# Patient Record
Sex: Female | Born: 1945
Health system: Southern US, Community
[De-identification: ages and names within clinical notes are randomized; demographics above are authoritative.]

## PROBLEM LIST (undated history)

## (undated) DIAGNOSIS — E079 Disorder of thyroid, unspecified: Secondary | ICD-10-CM

## (undated) DIAGNOSIS — I10 Essential (primary) hypertension: Secondary | ICD-10-CM

## (undated) HISTORY — PX: ABDOMINAL HYSTERECTOMY: SHX81

## (undated) HISTORY — PX: BREAST SURGERY: SHX581

---

## 1997-05-02 ENCOUNTER — Ambulatory Visit (HOSPITAL_COMMUNITY): Admission: RE | Admit: 1997-05-02 | Discharge: 1997-05-02 | Payer: Self-pay | Admitting: Obstetrics and Gynecology

## 1998-07-31 ENCOUNTER — Other Ambulatory Visit: Admission: RE | Admit: 1998-07-31 | Discharge: 1998-07-31 | Payer: Self-pay | Admitting: *Deleted

## 1998-07-31 ENCOUNTER — Other Ambulatory Visit: Admission: RE | Admit: 1998-07-31 | Discharge: 1998-07-31 | Payer: Self-pay | Admitting: Obstetrics and Gynecology

## 1999-08-28 ENCOUNTER — Other Ambulatory Visit: Admission: RE | Admit: 1999-08-28 | Discharge: 1999-08-28 | Payer: Self-pay | Admitting: Obstetrics and Gynecology

## 2000-01-28 ENCOUNTER — Encounter: Payer: Self-pay | Admitting: Family Medicine

## 2000-01-28 ENCOUNTER — Encounter: Admission: RE | Admit: 2000-01-28 | Discharge: 2000-01-28 | Payer: Self-pay | Admitting: Family Medicine

## 2000-07-03 ENCOUNTER — Ambulatory Visit (HOSPITAL_COMMUNITY): Admission: RE | Admit: 2000-07-03 | Discharge: 2000-07-03 | Payer: Self-pay | Admitting: Family Medicine

## 2000-07-03 ENCOUNTER — Encounter: Admission: RE | Admit: 2000-07-03 | Discharge: 2000-07-03 | Payer: Self-pay | Admitting: Family Medicine

## 2000-07-17 ENCOUNTER — Ambulatory Visit (HOSPITAL_COMMUNITY): Admission: RE | Admit: 2000-07-17 | Discharge: 2000-07-17 | Payer: Self-pay | Admitting: Family Medicine

## 2000-09-15 ENCOUNTER — Other Ambulatory Visit: Admission: RE | Admit: 2000-09-15 | Discharge: 2000-09-15 | Payer: Self-pay | Admitting: Obstetrics and Gynecology

## 2001-10-22 ENCOUNTER — Other Ambulatory Visit: Admission: RE | Admit: 2001-10-22 | Discharge: 2001-10-22 | Payer: Self-pay | Admitting: Obstetrics and Gynecology

## 2002-03-08 ENCOUNTER — Ambulatory Visit (HOSPITAL_COMMUNITY): Admission: RE | Admit: 2002-03-08 | Discharge: 2002-03-08 | Payer: Self-pay | Admitting: Internal Medicine

## 2002-03-08 ENCOUNTER — Emergency Department (HOSPITAL_COMMUNITY): Admission: EM | Admit: 2002-03-08 | Discharge: 2002-03-08 | Payer: Self-pay | Admitting: Emergency Medicine

## 2002-03-08 ENCOUNTER — Encounter: Payer: Self-pay | Admitting: Emergency Medicine

## 2002-03-08 ENCOUNTER — Encounter: Payer: Self-pay | Admitting: Internal Medicine

## 2002-04-22 ENCOUNTER — Encounter: Admission: RE | Admit: 2002-04-22 | Discharge: 2002-04-22 | Payer: Self-pay | Admitting: Endocrinology

## 2002-04-22 ENCOUNTER — Encounter: Payer: Self-pay | Admitting: Endocrinology

## 2002-10-26 ENCOUNTER — Other Ambulatory Visit: Admission: RE | Admit: 2002-10-26 | Discharge: 2002-10-26 | Payer: Self-pay | Admitting: Obstetrics and Gynecology

## 2003-06-03 ENCOUNTER — Ambulatory Visit (HOSPITAL_COMMUNITY): Admission: RE | Admit: 2003-06-03 | Discharge: 2003-06-03 | Payer: Self-pay | Admitting: Endocrinology

## 2003-06-23 ENCOUNTER — Ambulatory Visit (HOSPITAL_COMMUNITY): Admission: RE | Admit: 2003-06-23 | Discharge: 2003-06-23 | Payer: Self-pay | Admitting: Neurology

## 2004-08-07 ENCOUNTER — Ambulatory Visit: Payer: Self-pay | Admitting: Gastroenterology

## 2004-11-30 ENCOUNTER — Other Ambulatory Visit: Admission: RE | Admit: 2004-11-30 | Discharge: 2004-11-30 | Payer: Self-pay | Admitting: Obstetrics and Gynecology

## 2009-02-10 ENCOUNTER — Encounter: Admission: RE | Admit: 2009-02-10 | Discharge: 2009-02-10 | Payer: Self-pay | Admitting: Obstetrics and Gynecology

## 2010-08-17 NOTE — Op Note (Signed)
NAME:  Maria Houston, Maria Houston                       ACCOUNT NO.:  [id number]   MEDICAL RECORD NO.:  [id number]                   PATIENT TYPE:  OUT   LOCATION:  [facility]                                  FACILITY:  [facility]   PHYSICIAN:  Melvyn Novas, M.D.               DATE OF BIRTH:  01/06/1946   DATE OF PROCEDURE:  06/23/2003  DATE OF DISCHARGE:                                 OPERATIVE REPORT   PROCEDURES:  Lumbar puncture for the evaluation of possible demyelination  disease __________.   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  She  also has significant periventricular white matter lesions by MRI.   DESCRIPTION OF PROCEDURE:  The patient was placed at the beside bending  forwards and easily anatomic landmarks were found.  Access was between L2  and L3 after lidocaine topical anesthesia and Betadine preparation.  Four  vials of 2.5 mL of cerebrospinal fluid each were obtained and are going to  be sent for cell count, differential, glucose, protein, Gram's stain,  oligoclonal bands, Lyme disease antigen, and Gardnerella __________ antigen,  the cat scratch disease antigen.  The rest of the fluid, if any is left,  will be refrigerated for further reference.  The patient proceeded to  tolerate the procedure well.  She will go home with a headache prescription  in case she develops spinal problems.                                               Melvyn Novas, M.D.    CD/MEDQ  D:  06/23/2003  T:  06/23/2003  Job:  [id number]   cc:   [facility]

## 2011-06-04 DIAGNOSIS — Z1289 Encounter for screening for malignant neoplasm of other sites: Secondary | ICD-10-CM | POA: Diagnosis not present

## 2011-06-04 DIAGNOSIS — Z1231 Encounter for screening mammogram for malignant neoplasm of breast: Secondary | ICD-10-CM | POA: Diagnosis not present

## 2012-02-24 DIAGNOSIS — H40019 Open angle with borderline findings, low risk, unspecified eye: Secondary | ICD-10-CM | POA: Diagnosis not present

## 2012-03-26 DIAGNOSIS — I519 Heart disease, unspecified: Secondary | ICD-10-CM | POA: Diagnosis not present

## 2012-03-26 DIAGNOSIS — M79609 Pain in unspecified limb: Secondary | ICD-10-CM | POA: Diagnosis not present

## 2012-03-26 DIAGNOSIS — R262 Difficulty in walking, not elsewhere classified: Secondary | ICD-10-CM | POA: Diagnosis not present

## 2012-03-26 DIAGNOSIS — M25569 Pain in unspecified knee: Secondary | ICD-10-CM | POA: Diagnosis not present

## 2012-03-26 DIAGNOSIS — K3580 Unspecified acute appendicitis: Secondary | ICD-10-CM | POA: Diagnosis not present

## 2012-03-26 DIAGNOSIS — M25469 Effusion, unspecified knee: Secondary | ICD-10-CM | POA: Diagnosis not present

## 2012-03-29 DIAGNOSIS — M25569 Pain in unspecified knee: Secondary | ICD-10-CM | POA: Diagnosis not present

## 2012-03-29 DIAGNOSIS — M6580 Other synovitis and tenosynovitis, unspecified site: Secondary | ICD-10-CM | POA: Diagnosis not present

## 2012-04-27 DIAGNOSIS — IMO0002 Reserved for concepts with insufficient information to code with codable children: Secondary | ICD-10-CM | POA: Diagnosis not present

## 2012-04-27 DIAGNOSIS — H16109 Unspecified superficial keratitis, unspecified eye: Secondary | ICD-10-CM | POA: Diagnosis not present

## 2012-05-02 DIAGNOSIS — M1710 Unilateral primary osteoarthritis, unspecified knee: Secondary | ICD-10-CM | POA: Diagnosis not present

## 2012-05-11 DIAGNOSIS — IMO0002 Reserved for concepts with insufficient information to code with codable children: Secondary | ICD-10-CM | POA: Diagnosis not present

## 2012-06-08 DIAGNOSIS — IMO0002 Reserved for concepts with insufficient information to code with codable children: Secondary | ICD-10-CM | POA: Diagnosis not present

## 2012-06-22 DIAGNOSIS — IMO0002 Reserved for concepts with insufficient information to code with codable children: Secondary | ICD-10-CM | POA: Diagnosis not present

## 2012-07-09 DIAGNOSIS — Z1231 Encounter for screening mammogram for malignant neoplasm of breast: Secondary | ICD-10-CM | POA: Diagnosis not present

## 2012-07-09 DIAGNOSIS — N9089 Other specified noninflammatory disorders of vulva and perineum: Secondary | ICD-10-CM | POA: Diagnosis not present

## 2012-07-09 DIAGNOSIS — Z1289 Encounter for screening for malignant neoplasm of other sites: Secondary | ICD-10-CM | POA: Diagnosis not present

## 2012-07-20 DIAGNOSIS — M25579 Pain in unspecified ankle and joints of unspecified foot: Secondary | ICD-10-CM | POA: Diagnosis not present

## 2012-07-20 DIAGNOSIS — M1710 Unilateral primary osteoarthritis, unspecified knee: Secondary | ICD-10-CM | POA: Diagnosis not present

## 2012-08-31 DIAGNOSIS — M1710 Unilateral primary osteoarthritis, unspecified knee: Secondary | ICD-10-CM | POA: Diagnosis not present

## 2012-11-10 DIAGNOSIS — H40019 Open angle with borderline findings, low risk, unspecified eye: Secondary | ICD-10-CM | POA: Diagnosis not present

## 2012-11-10 DIAGNOSIS — H16109 Unspecified superficial keratitis, unspecified eye: Secondary | ICD-10-CM | POA: Diagnosis not present

## 2013-02-02 DIAGNOSIS — F411 Generalized anxiety disorder: Secondary | ICD-10-CM | POA: Diagnosis not present

## 2013-02-02 DIAGNOSIS — M2550 Pain in unspecified joint: Secondary | ICD-10-CM | POA: Diagnosis not present

## 2013-02-02 DIAGNOSIS — E785 Hyperlipidemia, unspecified: Secondary | ICD-10-CM | POA: Diagnosis not present

## 2013-02-02 DIAGNOSIS — I10 Essential (primary) hypertension: Secondary | ICD-10-CM | POA: Diagnosis not present

## 2013-02-02 DIAGNOSIS — E039 Hypothyroidism, unspecified: Secondary | ICD-10-CM | POA: Diagnosis not present

## 2013-02-02 DIAGNOSIS — Z1331 Encounter for screening for depression: Secondary | ICD-10-CM | POA: Diagnosis not present

## 2013-02-02 DIAGNOSIS — Z0000 Encounter for general adult medical examination without abnormal findings: Secondary | ICD-10-CM | POA: Diagnosis not present

## 2013-02-02 DIAGNOSIS — E1169 Type 2 diabetes mellitus with other specified complication: Secondary | ICD-10-CM | POA: Diagnosis not present

## 2013-02-17 ENCOUNTER — Other Ambulatory Visit: Payer: Self-pay | Admitting: Obstetrics and Gynecology

## 2013-02-18 ENCOUNTER — Other Ambulatory Visit: Payer: Self-pay | Admitting: Obstetrics and Gynecology

## 2013-03-02 DIAGNOSIS — I10 Essential (primary) hypertension: Secondary | ICD-10-CM | POA: Diagnosis not present

## 2013-03-05 ENCOUNTER — Ambulatory Visit
Admission: RE | Admit: 2013-03-05 | Discharge: 2013-03-05 | Disposition: A | Discharge status: Home / Self Care | Payer: Medicare Other | Source: Ambulatory Visit | Attending: Obstetrics and Gynecology | Admitting: Obstetrics and Gynecology

## 2013-03-05 DIAGNOSIS — N6459 Other signs and symptoms in breast: Secondary | ICD-10-CM | POA: Diagnosis not present

## 2013-03-05 DIAGNOSIS — I10 Essential (primary) hypertension: Secondary | ICD-10-CM | POA: Diagnosis not present

## 2013-04-15 ENCOUNTER — Emergency Department (HOSPITAL_BASED_OUTPATIENT_CLINIC_OR_DEPARTMENT_OTHER)
Admission: EM | Admit: 2013-04-15 | Discharge: 2013-04-15 | Disposition: A | Discharge status: Home / Self Care | Payer: Medicare Other | Attending: Emergency Medicine | Admitting: Emergency Medicine

## 2013-04-15 ENCOUNTER — Emergency Department (HOSPITAL_BASED_OUTPATIENT_CLINIC_OR_DEPARTMENT_OTHER): Payer: Medicare Other

## 2013-04-15 ENCOUNTER — Encounter (HOSPITAL_BASED_OUTPATIENT_CLINIC_OR_DEPARTMENT_OTHER): Payer: Self-pay | Admitting: Emergency Medicine

## 2013-04-15 DIAGNOSIS — R5381 Other malaise: Secondary | ICD-10-CM | POA: Diagnosis not present

## 2013-04-15 DIAGNOSIS — I10 Essential (primary) hypertension: Secondary | ICD-10-CM | POA: Diagnosis not present

## 2013-04-15 DIAGNOSIS — R059 Cough, unspecified: Secondary | ICD-10-CM | POA: Diagnosis not present

## 2013-04-15 DIAGNOSIS — R05 Cough: Secondary | ICD-10-CM | POA: Diagnosis not present

## 2013-04-15 DIAGNOSIS — E079 Disorder of thyroid, unspecified: Secondary | ICD-10-CM | POA: Diagnosis not present

## 2013-04-15 DIAGNOSIS — Z79899 Other long term (current) drug therapy: Secondary | ICD-10-CM | POA: Insufficient documentation

## 2013-04-15 DIAGNOSIS — R21 Rash and other nonspecific skin eruption: Secondary | ICD-10-CM | POA: Insufficient documentation

## 2013-04-15 DIAGNOSIS — J209 Acute bronchitis, unspecified: Secondary | ICD-10-CM | POA: Insufficient documentation

## 2013-04-15 DIAGNOSIS — R5383 Other fatigue: Secondary | ICD-10-CM

## 2013-04-15 HISTORY — DX: Disorder of thyroid, unspecified: E07.9

## 2013-04-15 HISTORY — DX: Essential (primary) hypertension: I10

## 2013-04-15 MED ORDER — AZITHROMYCIN 250 MG PO TABS: ORAL_TABLET | ORAL | Status: AC

## 2013-04-15 MED ORDER — PREDNISONE 10 MG PO TABS: 20.0000 mg | ORAL_TABLET | Freq: Two times a day (BID) | ORAL | Status: AC

## 2013-04-15 MED ORDER — HYDROCOD POLST-CHLORPHEN POLST 10-8 MG/5ML PO LQCR: 5.0000 mL | Freq: Two times a day (BID) | ORAL | Status: AC | PRN

## 2013-04-15 NOTE — ED Notes (Signed)
Dry cough x 3 weeks 

## 2013-04-15 NOTE — Discharge Instructions (Signed)
Zithromax as prescribed.  Robitussin as prescribed as needed for cough.   Prednisone as prescribed.   Bronchitis Bronchitis is inflammation of the airways that extend from the windpipe into the lungs (bronchi). The inflammation often causes mucus to develop, which leads to a cough. If the inflammation becomes severe, it may cause shortness of breath. CAUSES  Bronchitis may be caused by:   Viral infections.   Bacteria.   Cigarette smoke.   Allergens, pollutants, and other irritants.  SIGNS AND SYMPTOMS  The most common symptom of bronchitis is a frequent cough that produces mucus. Other symptoms include:  Fever.   Body aches.   Chest congestion.   Chills.   Shortness of breath.   Sore throat.  DIAGNOSIS  Bronchitis is usually diagnosed through a medical history and physical exam. Tests, such as chest X-rays, are sometimes done to rule out other conditions.  TREATMENT  You may need to avoid contact with whatever caused the problem (smoking, for example). Medicines are sometimes needed. These may include:  Antibiotics. These may be prescribed if the condition is caused by bacteria.  Cough suppressants. These may be prescribed for relief of cough symptoms.   Inhaled medicines. These may be prescribed to help open your airways and make it easier for you to breathe.   Steroid medicines. These may be prescribed for those with recurrent (chronic) bronchitis. HOME CARE INSTRUCTIONS  Get plenty of rest.   Drink enough fluids to keep your urine clear or pale yellow (unless you have a medical condition that requires fluid restriction). Increasing fluids may help thin your secretions and will prevent dehydration.   Only take over-the-counter or prescription medicines as directed by your health care provider.  Only take antibiotics as directed. Make sure you finish them even if you start to feel better.  Avoid secondhand smoke, irritating chemicals, and strong  fumes. These will make bronchitis worse. If you are a smoker, quit smoking. Consider using nicotine gum or skin patches to help control withdrawal symptoms. Quitting smoking will help your lungs heal faster.   Put a cool-mist humidifier in your bedroom at night to moisten the air. This may help loosen mucus. Change the water in the humidifier daily. You can also run the hot water in your shower and sit in the bathroom with the door closed for 5 10 minutes.   Follow up with your health care provider as directed.   Wash your hands frequently to avoid catching bronchitis again or spreading an infection to others.  SEEK MEDICAL CARE IF: Your symptoms do not improve after 1 week of treatment.  SEEK IMMEDIATE MEDICAL CARE IF:  Your fever increases.  You have chills.   You have chest pain.   You have worsening shortness of breath.   You have bloody sputum.  You faint.  You have lightheadedness.  You have a severe headache.   You vomit repeatedly. MAKE SURE YOU:   Understand these instructions.  Will watch your condition.  Will get help right away if you are not doing well or get worse. Document Released: 03/18/2005 Document Revised: 01/06/2013 Document Reviewed: 11/10/2012 [facility] Patient Information 2014 [place], [place].

## 2013-04-15 NOTE — ED Provider Notes (Signed)
CSN: [id number]     Arrival date & time 04/15/13  1714 History  This chart was scribed for Geoffery Lyonsouglas Jaclene Bartelt, MD by Leone PayorSonum Patel, ED Scribe. This patient was seen in room MH12/MH12 and the patient's care was started 6:48 PM.    Chief Complaint  Patient presents with  . Influenza    The history is provided by the patient. No language interpreter was used.    HPI Comments: Maria Houston is a 68 y.o. female who presents to the Emergency Department complaining of about 3 weeks of constant, unchanged cough. She reports traveling via plane over the holidays and began to have these symptoms afterwards. She has tried OTC cough medications and applied Vicks vapor rub without relief. She also reports having associated generalized weakness along with a rash to the left lateral lower leg. She states the rash itches but is not painful. She denies recent antibiotic use.    Past Medical History  Diagnosis Date  . Thyroid disease   . Hypertension    Past Surgical History  Procedure Laterality Date  . Breast surgery    . Abdominal hysterectomy     No family history on file. History  Substance Use Topics  . Smoking status: Never Smoker   . Smokeless tobacco: Not on file  . Alcohol Use: Yes   OB History   Grav Para Term Preterm Abortions TAB SAB Ect Mult Living                 Review of Systems A complete 10 system review of systems was obtained and all systems are negative except as noted in the HPI and PMH.   Allergies  Codeine and Sulfa antibiotics  Home Medications   Current Outpatient Rx  Name  Route  Sig  Dispense  Refill  . Levothyroxine Sodium (SYNTHROID PO)   Oral   Take by mouth.          BP 188/87  Pulse 110  Temp(Src) 98.6 F (37 C) (Oral)  Resp 20  Ht 5\' 8"  (1.727 m)  Wt 179 lb (81.194 kg)  BMI 27.22 kg/m2  SpO2 99% Physical Exam  Nursing note and vitals reviewed. Constitutional: She is oriented to person, place, and time. She appears well-developed and  well-nourished.  HENT:  Head: Normocephalic and atraumatic.  Cardiovascular: Normal rate, regular rhythm and normal heart sounds.  Exam reveals no gallop and no friction rub.   No murmur heard. Pulmonary/Chest: Effort normal and breath sounds normal. No respiratory distress. She has no wheezes. She has no rales.  Abdominal: She exhibits no distension.  Neurological: She is alert and oriented to person, place, and time.  Skin: Skin is warm and dry.  Raised, macular rash to the lateral aspect of the left lowe leg. Blanches and is somewhat warm to touch.   Psychiatric: She has a normal mood and affect.    ED Course  Procedures (including critical care time)  DIAGNOSTIC STUDIES: Oxygen Saturation is 99% on RA, normal by my interpretation.    COORDINATION OF CARE: 6:47 PM Discussed treatment plan with pt at bedside and pt agreed to plan.   Labs Review Labs Reviewed - No data to display Imaging Review No results found.    MDM  No diagnosis found. Patient presents here with a three-week history of persistent URI. She is having no improvement with over-the-counter medications and continues to cough persistently. Her lungs sound clear and chest x-ray is unremarkable, however due to the length of  symptoms I will treat with Zithromax for suspected bronchitis. I will also prescribe a prednisone and a cough syrup. She is to return if her symptoms worsen or change.  I personally performed the services described in this documentation, which was scribed in my presence. The recorded information has been reviewed and is accurate.      Geoffery Lyons, MD 04/15/13 [phone]

## 2013-05-05 DIAGNOSIS — E1169 Type 2 diabetes mellitus with other specified complication: Secondary | ICD-10-CM | POA: Diagnosis not present

## 2013-05-05 DIAGNOSIS — Z6828 Body mass index (BMI) 28.0-28.9, adult: Secondary | ICD-10-CM | POA: Diagnosis not present

## 2013-05-05 DIAGNOSIS — E039 Hypothyroidism, unspecified: Secondary | ICD-10-CM | POA: Diagnosis not present

## 2013-05-05 DIAGNOSIS — I10 Essential (primary) hypertension: Secondary | ICD-10-CM | POA: Diagnosis not present

## 2013-05-05 DIAGNOSIS — E785 Hyperlipidemia, unspecified: Secondary | ICD-10-CM | POA: Diagnosis not present

## 2013-05-13 DIAGNOSIS — H40019 Open angle with borderline findings, low risk, unspecified eye: Secondary | ICD-10-CM | POA: Diagnosis not present

## 2013-07-20 DIAGNOSIS — Z1231 Encounter for screening mammogram for malignant neoplasm of breast: Secondary | ICD-10-CM | POA: Diagnosis not present

## 2013-07-20 DIAGNOSIS — Z1289 Encounter for screening for malignant neoplasm of other sites: Secondary | ICD-10-CM | POA: Diagnosis not present

## 2013-09-15 DIAGNOSIS — E039 Hypothyroidism, unspecified: Secondary | ICD-10-CM | POA: Diagnosis not present

## 2013-09-15 DIAGNOSIS — E1169 Type 2 diabetes mellitus with other specified complication: Secondary | ICD-10-CM | POA: Diagnosis not present

## 2013-09-15 DIAGNOSIS — I10 Essential (primary) hypertension: Secondary | ICD-10-CM | POA: Diagnosis not present

## 2013-09-15 DIAGNOSIS — Z0000 Encounter for general adult medical examination without abnormal findings: Secondary | ICD-10-CM | POA: Diagnosis not present

## 2013-09-15 DIAGNOSIS — E785 Hyperlipidemia, unspecified: Secondary | ICD-10-CM | POA: Diagnosis not present

## 2013-09-15 DIAGNOSIS — M2550 Pain in unspecified joint: Secondary | ICD-10-CM | POA: Diagnosis not present

## 2013-09-15 DIAGNOSIS — Z6828 Body mass index (BMI) 28.0-28.9, adult: Secondary | ICD-10-CM | POA: Diagnosis not present

## 2013-11-11 DIAGNOSIS — H40019 Open angle with borderline findings, low risk, unspecified eye: Secondary | ICD-10-CM | POA: Diagnosis not present

## 2014-01-25 DIAGNOSIS — I10 Essential (primary) hypertension: Secondary | ICD-10-CM | POA: Diagnosis not present

## 2014-01-25 DIAGNOSIS — F419 Anxiety disorder, unspecified: Secondary | ICD-10-CM | POA: Diagnosis not present

## 2014-01-25 DIAGNOSIS — E039 Hypothyroidism, unspecified: Secondary | ICD-10-CM | POA: Diagnosis not present

## 2014-01-25 DIAGNOSIS — M2550 Pain in unspecified joint: Secondary | ICD-10-CM | POA: Diagnosis not present

## 2014-01-25 DIAGNOSIS — Z6829 Body mass index (BMI) 29.0-29.9, adult: Secondary | ICD-10-CM | POA: Diagnosis not present

## 2014-01-25 DIAGNOSIS — E119 Type 2 diabetes mellitus without complications: Secondary | ICD-10-CM | POA: Diagnosis not present

## 2014-01-28 IMAGING — CR DG CHEST 2V
2 series · 2 of 2 positions shown · non-contrast
Comparison: None.

CLINICAL DATA: Three week history of cough, chest congestion, and
sore throat.

EXAM:
CHEST  2 VIEW

[w chest pa]
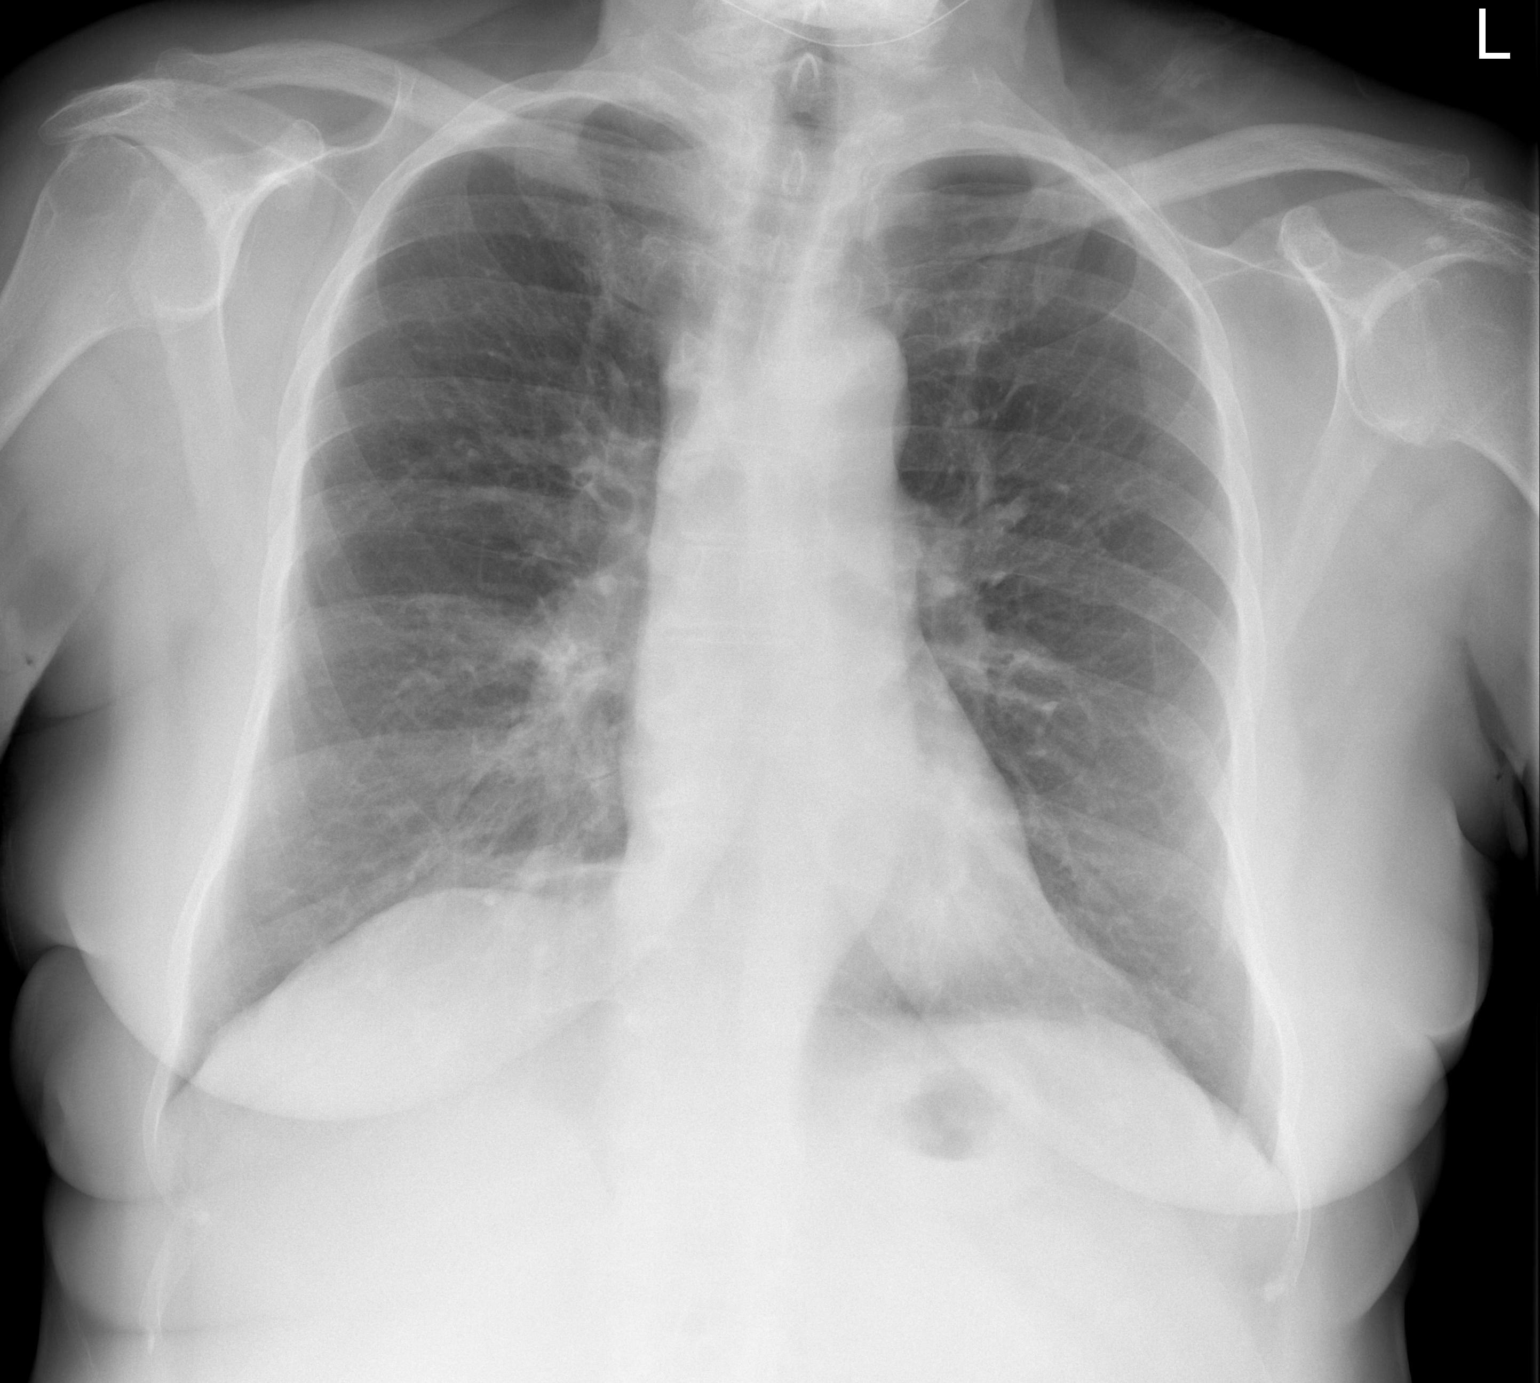

[w chest lat]
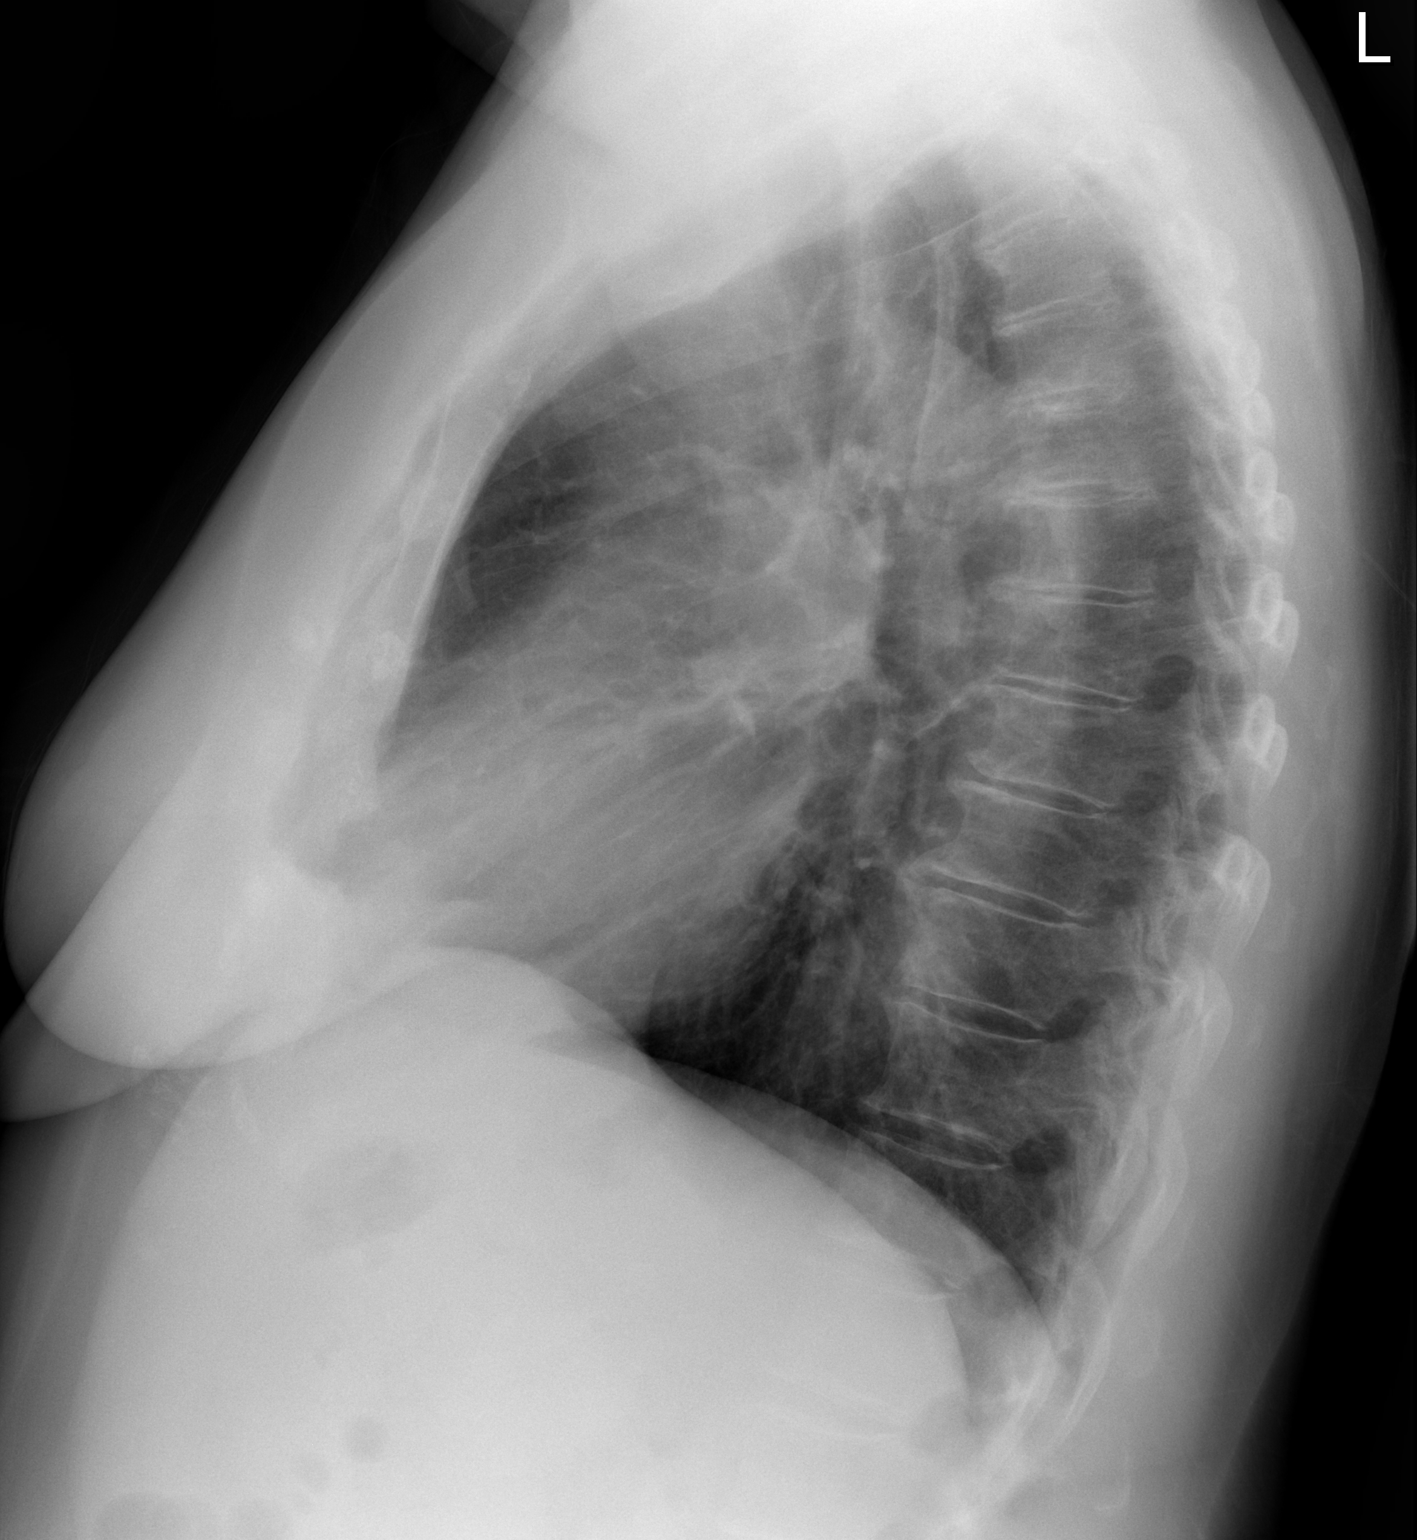

[2 of 2 positions shown; findings below may reference images not displayed]

FINDINGS: Cardiomediastinal silhouette unremarkable. Linear scar or
atelectasis in the right middle lobe. Lungs otherwise clear. No
localized airspace consolidation. No pleural effusions. No
pneumothorax. Normal pulmonary vascularity. Degenerative changes
involving the thoracic spine.
IMPRESSION: Linear scar or atelectasis in the right middle lobe. No acute
cardiopulmonary disease otherwise.

## 2014-03-16 ENCOUNTER — Encounter (HOSPITAL_BASED_OUTPATIENT_CLINIC_OR_DEPARTMENT_OTHER): Payer: Self-pay | Admitting: *Deleted

## 2014-03-16 ENCOUNTER — Emergency Department (HOSPITAL_BASED_OUTPATIENT_CLINIC_OR_DEPARTMENT_OTHER): Payer: Medicare Other

## 2014-03-16 ENCOUNTER — Emergency Department (HOSPITAL_BASED_OUTPATIENT_CLINIC_OR_DEPARTMENT_OTHER)
Admission: EM | Admit: 2014-03-16 | Discharge: 2014-03-16 | Disposition: A | Discharge status: Home / Self Care | Payer: Medicare Other | Attending: Emergency Medicine | Admitting: Emergency Medicine

## 2014-03-16 DIAGNOSIS — I159 Secondary hypertension, unspecified: Secondary | ICD-10-CM | POA: Diagnosis not present

## 2014-03-16 DIAGNOSIS — Z7951 Long term (current) use of inhaled steroids: Secondary | ICD-10-CM | POA: Insufficient documentation

## 2014-03-16 DIAGNOSIS — S61411A Laceration without foreign body of right hand, initial encounter: Secondary | ICD-10-CM | POA: Diagnosis not present

## 2014-03-16 DIAGNOSIS — Y9289 Other specified places as the place of occurrence of the external cause: Secondary | ICD-10-CM | POA: Insufficient documentation

## 2014-03-16 DIAGNOSIS — W260XXA Contact with knife, initial encounter: Secondary | ICD-10-CM | POA: Diagnosis not present

## 2014-03-16 DIAGNOSIS — I10 Essential (primary) hypertension: Secondary | ICD-10-CM | POA: Diagnosis not present

## 2014-03-16 DIAGNOSIS — S60559A Superficial foreign body of unspecified hand, initial encounter: Secondary | ICD-10-CM

## 2014-03-16 DIAGNOSIS — M79641 Pain in right hand: Secondary | ICD-10-CM | POA: Insufficient documentation

## 2014-03-16 DIAGNOSIS — Z23 Encounter for immunization: Secondary | ICD-10-CM | POA: Diagnosis not present

## 2014-03-16 DIAGNOSIS — E079 Disorder of thyroid, unspecified: Secondary | ICD-10-CM | POA: Insufficient documentation

## 2014-03-16 DIAGNOSIS — Z79899 Other long term (current) drug therapy: Secondary | ICD-10-CM | POA: Insufficient documentation

## 2014-03-16 DIAGNOSIS — Y998 Other external cause status: Secondary | ICD-10-CM | POA: Insufficient documentation

## 2014-03-16 DIAGNOSIS — Y9389 Activity, other specified: Secondary | ICD-10-CM | POA: Insufficient documentation

## 2014-03-16 MED ORDER — TETANUS-DIPHTH-ACELL PERTUSSIS 5-2.5-18.5 LF-MCG/0.5 IM SUSP
0.5000 mL | Freq: Once | INTRAMUSCULAR | Status: AC
  Administered 2014-03-16: 0.5 mL via INTRAMUSCULAR
  Filled 2014-03-16: qty 0.5

## 2014-03-16 MED ORDER — LIDOCAINE HCL 2 % IJ SOLN
5.0000 mL | Freq: Once | INTRAMUSCULAR | Status: AC
  Administered 2014-03-16: 100 mg via INTRADERMAL
  Filled 2014-03-16: qty 20

## 2014-03-16 NOTE — ED Notes (Signed)
PA at bedside.

## 2014-03-16 NOTE — ED Notes (Signed)
Pt c/o ? Foreign body in right hand x 1 week

## 2014-03-16 NOTE — ED Provider Notes (Signed)
CSN: [id number]     Arrival date & time 03/16/14  [id number] History   First MD Initiated Contact with Patient 03/16/14 2205     Chief Complaint  Patient presents with  . Foreign Body     (Consider location/radiation/quality/duration/timing/severity/associated sxs/prior Treatment) HPI Maria Houston is a 68 y.o. female presents to emergency department with complaint of a pain over laceration on the right hand. Patient states she was using a knife, when it slipped and states went into the right palm of the hand. States this happened a week ago. Since then she has had persistent pain in hand, states pain now radiates to the distal hand and up to the forearm. She denies any numbness or weakness in her hand. She denies any redness. No drainage from the wound. States she has not been treating it with any medications. Patient is here because of the concern about persistent pain, states feels like there's something in there.     Past Medical History  Diagnosis Date  . Thyroid disease   . Hypertension    Past Surgical History  Procedure Laterality Date  . Breast surgery    . Abdominal hysterectomy     History reviewed. No pertinent family history. History  Substance Use Topics  . Smoking status: Never Smoker   . Smokeless tobacco: Not on file  . Alcohol Use: Yes   OB History    No data available     Review of Systems  Constitutional: Negative for fever and chills.  Musculoskeletal: Positive for myalgias and arthralgias.  Skin: Positive for wound.  Neurological: Negative for weakness and numbness.      Allergies  Codeine and Sulfa antibiotics  Home Medications   Prior to Admission medications   Medication Sig Start Date End Date Taking? Authorizing Provider  azithromycin (ZITHROMAX Z-PAK) 250 MG tablet 2 po day one, then 1 daily x 4 days 04/15/13   Geoffery Lyonsouglas Delo, MD  chlorpheniramine-HYDROcodone [facility](TUSSIONEX PENNKINETIC ER) 10-8 MG/5ML LQCR Take 5 mLs by mouth every 12 (twelve)  hours as needed for cough. 04/15/13   Geoffery Lyonsouglas Delo, MD  Levothyroxine Sodium (SYNTHROID PO) Take by mouth.    Historical Provider, MD  predniSONE (DELTASONE) 10 MG tablet Take 2 tablets (20 mg total) by mouth 2 (two) times daily. 04/15/13   Geoffery Lyonsouglas Delo, MD   BP 193/92 mmHg  Pulse 102  Temp(Src) 98.2 F (36.8 C) (Oral)  Resp 16  Ht 5\' 8"  (1.727 m)  Wt 170 lb (77.111 kg)  BMI 25.85 kg/m2  SpO2 96% Physical Exam  Constitutional: She is oriented to person, place, and time. She appears well-developed and well-nourished. No distress.  Musculoskeletal:  Small puncture wound to the thenar eminence of the right hand. There is a hard scab versus a foreign body superficially in the laceration. No surrounding erythema. No drainage from the wound. Full range of motion of all fingers, patient is able to do thumbs up, oppose thumb to each finger, spread all the fingers. Sensation of the thumb distally is intact. Cap refill less than 2 seconds.  Neurological: She is alert and oriented to person, place, and time.  Skin: Skin is warm and dry.  Nursing note reviewed.   ED Course  Procedures (including critical care time) Labs Review Labs Reviewed - No data to display  Imaging Review Dg Hand Complete Right  03/16/2014   CLINICAL DATA:  Cut hand with knife 1 week ago, palm laceration, possible foreign body  EXAM: RIGHT HAND - COMPLETE 3+  VIEW  COMPARISON:  None.  FINDINGS: Three views of the right hand submitted. No acute fracture or subluxation. No radiopaque foreign body is identified. Degenerative changes distal interphalangeal joints. Degenerative changes first metacarpophalangeal joint. Degenerative changes first carpometacarpal joint and radiocarpal joint. Diffuse osteopenia.  IMPRESSION: No acute fracture or subluxation. Multilevel degenerative changes as described above. No radiopaque foreign body.   Electronically Signed   By: Natasha MeadLiviu  Pop M.D.   On: 03/16/2014 19:21     EKG Interpretation None       MDM   Final diagnoses:  Laceration of right hand, initial encounter  Secondary hypertension, unspecified    Patient with a possible foreign body in the right hand. X-ray obtained by triage nurse, showing no foreign body. I numbed the area using lidocaine, 2% without epi. I used an 11 blade to incise the laceration to explore for foreign bodies. No foreign bodies palpated or seen. Laceration is healing well otherwise with no signs of infection. Given patient's continued pain, concerning for possible damage to the nerve or a muscle in the hand. She has full range of motion of all the fingers and has no new deficits, however I think she will still need a close follow-up with a hand specialist. Patient's blood pressure noted to be elevated in NAD. Patient states she is deathly afraid of needles. Instructed to recheck with her primary care doctor.   Filed Vitals:   03/16/14 1832 03/16/14 2234  BP: 193/92 172/94  Pulse: 102 84  Temp: 98.2 F (36.8 C)   TempSrc: Oral   Resp: 16   Height: 5\' 8"  (1.727 m)   Weight: 170 lb (77.111 kg)   SpO2: 96% 97%      Lottie Musselatyana A Lennette Fader, PA-C 03/16/14 2242  Hilario Quarryanielle S Ray, MD 03/17/14 [phone]

## 2014-03-16 NOTE — Discharge Instructions (Signed)
Keep laceration clean. Keep thumb spika splint on for immobilization. Apply bacitracin twice a day. If pain persists follow up with Dr. Amanda PeaGramig for recheck.   Laceration Care, Adult A laceration is a cut or lesion that goes through all layers of the skin and into the tissue just beneath the skin. TREATMENT  Some lacerations may not require closure. Some lacerations may not be able to be closed due to an increased risk of infection. It is important to see your caregiver as soon as possible after an injury to minimize the risk of infection and maximize the opportunity for successful closure. If closure is appropriate, pain medicines may be given, if needed. The wound will be cleaned to help prevent infection. Your caregiver will use stitches (sutures), staples, wound glue (adhesive), or skin adhesive strips to repair the laceration. These tools bring the skin edges together to allow for faster healing and a better cosmetic outcome. However, all wounds will heal with a scar. Once the wound has healed, scarring can be minimized by covering the wound with sunscreen during the day for 1 full year. HOME CARE INSTRUCTIONS  For sutures or staples:  Keep the wound clean and dry.  If you were given a bandage (dressing), you should change it at least once a day. Also, change the dressing if it becomes wet or dirty, or as directed by your caregiver.  Wash the wound with soap and water 2 times a day. Rinse the wound off with water to remove all soap. Pat the wound dry with a clean towel.  After cleaning, apply a thin layer of the antibiotic ointment as recommended by your caregiver. This will help prevent infection and keep the dressing from sticking.  You may shower as usual after the first 24 hours. Do not soak the wound in water until the sutures are removed.  Only take over-the-counter or prescription medicines for pain, discomfort, or fever as directed by your caregiver.  Get your sutures or staples  removed as directed by your caregiver. For skin adhesive strips:  Keep the wound clean and dry.  Do not get the skin adhesive strips wet. You may bathe carefully, using caution to keep the wound dry.  If the wound gets wet, pat it dry with a clean towel.  Skin adhesive strips will fall off on their own. You may trim the strips as the wound heals. Do not remove skin adhesive strips that are still stuck to the wound. They will fall off in time. For wound adhesive:  You may briefly wet your wound in the shower or bath. Do not soak or scrub the wound. Do not swim. Avoid periods of heavy perspiration until the skin adhesive has fallen off on its own. After showering or bathing, gently pat the wound dry with a clean towel.  Do not apply liquid medicine, cream medicine, or ointment medicine to your wound while the skin adhesive is in place. This may loosen the film before your wound is healed.  If a dressing is placed over the wound, be careful not to apply tape directly over the skin adhesive. This may cause the adhesive to be pulled off before the wound is healed.  Avoid prolonged exposure to sunlight or tanning lamps while the skin adhesive is in place. Exposure to ultraviolet light in the first year will darken the scar.  The skin adhesive will usually remain in place for 5 to 10 days, then naturally fall off the skin. Do not pick at the  adhesive film. You may need a tetanus shot if:  You cannot remember when you had your last tetanus shot.  You have never had a tetanus shot. If you get a tetanus shot, your arm may swell, get red, and feel warm to the touch. This is common and not a problem. If you need a tetanus shot and you choose not to have one, there is a rare chance of getting tetanus. Sickness from tetanus can be serious. SEEK MEDICAL CARE IF:   You have redness, swelling, or increasing pain in the wound.  You see a red line that goes away from the wound.  You have  yellowish-white fluid (pus) coming from the wound.  You have a fever.  You notice a bad smell coming from the wound or dressing.  Your wound breaks open before or after sutures have been removed.  You notice something coming out of the wound such as wood or glass.  Your wound is on your hand or foot and you cannot move a finger or toe. SEEK IMMEDIATE MEDICAL CARE IF:   Your pain is not controlled with prescribed medicine.  You have severe swelling around the wound causing pain and numbness or a change in color in your arm, hand, leg, or foot.  Your wound splits open and starts bleeding.  You have worsening numbness, weakness, or loss of function of any joint around or beyond the wound.  You develop painful lumps near the wound or on the skin anywhere on your body. MAKE SURE YOU:   Understand these instructions.  Will watch your condition.  Will get help right away if you are not doing well or get worse. Document Released: 03/18/2005 Document Revised: 06/10/2011 Document Reviewed: 09/11/2010 [facility] Patient Information 2015 [place], [place]. This information is not intended to replace advice given to you by your health care provider. Make sure you discuss any questions you have with your health care provider.

## 2014-05-25 DIAGNOSIS — I10 Essential (primary) hypertension: Secondary | ICD-10-CM | POA: Diagnosis not present

## 2014-05-25 DIAGNOSIS — E119 Type 2 diabetes mellitus without complications: Secondary | ICD-10-CM | POA: Diagnosis not present

## 2014-05-25 DIAGNOSIS — E785 Hyperlipidemia, unspecified: Secondary | ICD-10-CM | POA: Diagnosis not present

## 2014-05-25 DIAGNOSIS — Z6829 Body mass index (BMI) 29.0-29.9, adult: Secondary | ICD-10-CM | POA: Diagnosis not present

## 2014-05-25 DIAGNOSIS — E039 Hypothyroidism, unspecified: Secondary | ICD-10-CM | POA: Diagnosis not present

## 2014-05-25 DIAGNOSIS — M2550 Pain in unspecified joint: Secondary | ICD-10-CM | POA: Diagnosis not present

## 2014-08-05 ENCOUNTER — Emergency Department (HOSPITAL_BASED_OUTPATIENT_CLINIC_OR_DEPARTMENT_OTHER)
Admission: EM | Admit: 2014-08-05 | Discharge: 2014-08-05 | Disposition: A | Discharge status: Home / Self Care | Payer: Medicare Other | Attending: Emergency Medicine | Admitting: Emergency Medicine

## 2014-08-05 ENCOUNTER — Encounter (HOSPITAL_BASED_OUTPATIENT_CLINIC_OR_DEPARTMENT_OTHER): Payer: Self-pay | Admitting: Emergency Medicine

## 2014-08-05 DIAGNOSIS — E079 Disorder of thyroid, unspecified: Secondary | ICD-10-CM | POA: Diagnosis not present

## 2014-08-05 DIAGNOSIS — Z7952 Long term (current) use of systemic steroids: Secondary | ICD-10-CM | POA: Insufficient documentation

## 2014-08-05 DIAGNOSIS — R21 Rash and other nonspecific skin eruption: Secondary | ICD-10-CM | POA: Diagnosis present

## 2014-08-05 DIAGNOSIS — Z792 Long term (current) use of antibiotics: Secondary | ICD-10-CM | POA: Insufficient documentation

## 2014-08-05 DIAGNOSIS — I10 Essential (primary) hypertension: Secondary | ICD-10-CM | POA: Insufficient documentation

## 2014-08-05 DIAGNOSIS — B029 Zoster without complications: Secondary | ICD-10-CM | POA: Insufficient documentation

## 2014-08-05 MED ORDER — TRAMADOL HCL 50 MG PO TABS: 50.0000 mg | ORAL_TABLET | Freq: Four times a day (QID) | ORAL | Status: DC | PRN

## 2014-08-05 MED ORDER — VALACYCLOVIR HCL 1 G PO TABS: 1000.0000 mg | ORAL_TABLET | Freq: Three times a day (TID) | ORAL | Status: AC

## 2014-08-05 NOTE — Discharge Instructions (Signed)

## 2014-08-05 NOTE — ED Provider Notes (Signed)
CSN: [id number]     Arrival date & time 08/05/14  1531 History   First MD Initiated Contact with Patient 08/05/14 1555     Chief Complaint  Patient presents with  . Rash     (Consider location/radiation/quality/duration/timing/severity/associated sxs/prior Treatment) HPI Comments: Patient complains of a 2 day history of some pain under her left breast. She noticed a rash that developed today. She denies any fevers. There is no vomiting. She states the pain is been worsening over last 2 days. She denies any history of shingles. She's been using aspirin without relief in the pain.  Patient is a 69 y.o. female presenting with rash.  Rash Associated symptoms: no abdominal pain, no diarrhea, no fatigue, no fever, no headaches, no joint pain, no nausea, no shortness of breath and not vomiting     Past Medical History  Diagnosis Date  . Thyroid disease   . Hypertension    Past Surgical History  Procedure Laterality Date  . Breast surgery    . Abdominal hysterectomy     No family history on file. History  Substance Use Topics  . Smoking status: Never Smoker   . Smokeless tobacco: Not on file  . Alcohol Use: Yes   OB History    No data available     Review of Systems  Constitutional: Negative for fever, chills, diaphoresis and fatigue.  HENT: Negative for congestion, rhinorrhea and sneezing.   Eyes: Negative.   Respiratory: Negative for cough, chest tightness and shortness of breath.   Cardiovascular: Negative for chest pain and leg swelling.  Gastrointestinal: Negative for nausea, vomiting, abdominal pain, diarrhea and blood in stool.  Genitourinary: Negative for frequency, hematuria, flank pain and difficulty urinating.  Musculoskeletal: Negative for back pain and arthralgias.  Skin: Positive for rash.  Neurological: Negative for dizziness, speech difficulty, weakness, numbness and headaches.      Allergies  Codeine and Sulfa antibiotics  Home Medications   Prior to  Admission medications   Medication Sig Start Date End Date Taking? Authorizing Provider  azithromycin (ZITHROMAX Z-PAK) 250 MG tablet 2 po day one, then 1 daily x 4 days 04/15/13   Geoffery Lyonsouglas Delo, MD  chlorpheniramine-HYDROcodone [facility](TUSSIONEX PENNKINETIC ER) 10-8 MG/5ML LQCR Take 5 mLs by mouth every 12 (twelve) hours as needed for cough. 04/15/13   Geoffery Lyonsouglas Delo, MD  Levothyroxine Sodium (SYNTHROID PO) Take by mouth.    Historical Provider, MD  predniSONE (DELTASONE) 10 MG tablet Take 2 tablets (20 mg total) by mouth 2 (two) times daily. 04/15/13   Geoffery Lyonsouglas Delo, MD  traMADol (ULTRAM) 50 MG tablet Take 1 tablet (50 mg total) by mouth every 6 (six) hours as needed. 08/05/14   Rolan BuccoMelanie Kiasha Bellin, MD  valACYclovir (VALTREX) 1000 MG tablet Take 1 tablet (1,000 mg total) by mouth 3 (three) times daily. 08/05/14 08/19/14  Rolan BuccoMelanie Carina Chaplin, MD   BP 216/93 mmHg  Pulse 103  Temp(Src) 97.9 F (36.6 C) (Oral)  Resp 20  Ht 5\' 8"  (1.727 m)  Wt 178 lb (80.74 kg)  BMI 27.07 kg/m2  SpO2 98% Physical Exam  Constitutional: She is oriented to person, place, and time. She appears well-developed and well-nourished.  HENT:  Head: Normocephalic and atraumatic.  Eyes: Pupils are equal, round, and reactive to light.  Neck: Normal range of motion. Neck supple.  Cardiovascular: Normal rate, regular rhythm and normal heart sounds.   Pulmonary/Chest: Effort normal and breath sounds normal. No respiratory distress. She has no wheezes. She has no rales. She exhibits no  tenderness.  Abdominal: Soft. Bowel sounds are normal. There is no tenderness. There is no rebound and no guarding.  Musculoskeletal: Normal range of motion. She exhibits no edema.  Lymphadenopathy:    She has no cervical adenopathy.  Neurological: She is alert and oriented to person, place, and time.  Skin: Skin is warm and dry. Rash noted.  Positive vesicular raised erythematous rash under her left breast that extends from the anterior midline around to the back. There  is no crusting or signs of infection.  Psychiatric: She has a normal mood and affect.    ED Course  Procedures (including critical care time) Labs Review Labs Reviewed - No data to display  Imaging Review No results found.   EKG Interpretation None      MDM   Final diagnoses:  Shingles    Patient has a rash under the left breast which is consistent with shingles. She was started on Valtrex. She denies any kidney problems. She states she's been on oxycodone before and did not like how it made her feel. I will try tramadol for pain control. I encouraged her to make a follow-up appointment with her primary care physician for ongoing management. Of note her blood pressure was elevated in the ED. A recheck was 189/90 and she does have a history of hypertension and is on lisinopril. She takes it at night. She states her blood pressures always elevated when she comes to the doctor. She's asymptomatic from her elevated blood pressure. I advised her to keep an eye on it at home and if it remains high to contact her primary care physician.    Rolan BuccoMelanie Dan Scearce, MD 08/05/14 [phone]

## 2014-08-05 NOTE — ED Notes (Signed)
Painful rash under left breast

## 2014-08-17 ENCOUNTER — Encounter (HOSPITAL_BASED_OUTPATIENT_CLINIC_OR_DEPARTMENT_OTHER): Payer: Self-pay

## 2014-08-17 ENCOUNTER — Emergency Department (HOSPITAL_BASED_OUTPATIENT_CLINIC_OR_DEPARTMENT_OTHER)
Admission: EM | Admit: 2014-08-17 | Discharge: 2014-08-17 | Disposition: A | Discharge status: Home / Self Care | Payer: Medicare Other | Attending: Emergency Medicine | Admitting: Emergency Medicine

## 2014-08-17 DIAGNOSIS — B029 Zoster without complications: Secondary | ICD-10-CM | POA: Diagnosis not present

## 2014-08-17 DIAGNOSIS — Z79899 Other long term (current) drug therapy: Secondary | ICD-10-CM | POA: Insufficient documentation

## 2014-08-17 DIAGNOSIS — I10 Essential (primary) hypertension: Secondary | ICD-10-CM | POA: Insufficient documentation

## 2014-08-17 DIAGNOSIS — E079 Disorder of thyroid, unspecified: Secondary | ICD-10-CM | POA: Insufficient documentation

## 2014-08-17 DIAGNOSIS — Z792 Long term (current) use of antibiotics: Secondary | ICD-10-CM | POA: Diagnosis not present

## 2014-08-17 MED ORDER — GABAPENTIN 100 MG PO CAPS: 100.0000 mg | ORAL_CAPSULE | Freq: Three times a day (TID) | ORAL | Status: AC

## 2014-08-17 MED ORDER — OXYCODONE-ACETAMINOPHEN 5-325 MG PO TABS: 1.0000 | ORAL_TABLET | Freq: Three times a day (TID) | ORAL | Status: AC | PRN

## 2014-08-17 NOTE — ED Provider Notes (Signed)
CSN: [id number]     Arrival date & time 08/17/14  1718 History   First MD Initiated Contact with Patient 08/17/14 1744     Chief Complaint  Patient presents with  . Herpes Zoster     (Consider location/radiation/quality/duration/timing/severity/associated sxs/prior Treatment) HPI Comments: Pt comes in with c/o continued pain with shingles. Pt was diagnosed on 5/6. She was given ultram and valtrex. She states that she was given zofran because of vomiting from ultram. Because of the side effect of ultram she decided to stop taking it.she  States that she is not sleep at night  The history is provided by the patient. No language interpreter was used.    Past Medical History  Diagnosis Date  . Thyroid disease   . Hypertension    Past Surgical History  Procedure Laterality Date  . Breast surgery    . Abdominal hysterectomy     No family history on file. History  Substance Use Topics  . Smoking status: Never Smoker   . Smokeless tobacco: Not on file  . Alcohol Use: Yes   OB History    No data available     Review of Systems  All other systems reviewed and are negative.     Allergies  Codeine and Sulfa antibiotics  Home Medications   Prior to Admission medications   Medication Sig Start Date End Date Taking? Authorizing Provider  azithromycin (ZITHROMAX Z-PAK) 250 MG tablet 2 po day one, then 1 daily x 4 days 04/15/13   Geoffery Lyonsouglas Delo, MD  chlorpheniramine-HYDROcodone [facility](TUSSIONEX PENNKINETIC ER) 10-8 MG/5ML LQCR Take 5 mLs by mouth every 12 (twelve) hours as needed for cough. 04/15/13   Geoffery Lyonsouglas Delo, MD  gabapentin (NEURONTIN) 100 MG capsule Take 1 capsule (100 mg total) by mouth 3 (three) times daily. 08/17/14   Teressa LowerVrinda Rocco Kerkhoff, NP  Levothyroxine Sodium (SYNTHROID PO) Take by mouth.    Historical Provider, MD  oxyCODONE-acetaminophen (PERCOCET/ROXICET) 5-325 MG per tablet Take 1-2 tablets by mouth every 8 (eight) hours as needed for severe pain. 08/17/14   Teressa LowerVrinda Kennette Cuthrell,  NP  predniSONE (DELTASONE) 10 MG tablet Take 2 tablets (20 mg total) by mouth 2 (two) times daily. 04/15/13   Geoffery Lyonsouglas Delo, MD  valACYclovir (VALTREX) 1000 MG tablet Take 1 tablet (1,000 mg total) by mouth 3 (three) times daily. 08/05/14 08/19/14  Rolan BuccoMelanie Belfi, MD   BP 167/73 mmHg  Pulse 110  Temp(Src) 98.2 F (36.8 C) (Oral)  Resp 16  Ht 5\' 8"  (1.727 m)  Wt 178 lb (80.74 kg)  BMI 27.07 kg/m2  SpO2 97% Physical Exam  Constitutional: She is oriented to person, place, and time. She appears well-developed and well-nourished.  Cardiovascular: Normal rate and regular rhythm.   Pulmonary/Chest: Effort normal and breath sounds normal.  Musculoskeletal: Normal range of motion.  Neurological: She is alert and oriented to person, place, and time.  Skin:  Rash noted to under the left breast and around to the back. Erythematous without vesicle at this time  Nursing note and vitals reviewed.   ED Course  Procedures (including critical care time) Labs Review Labs Reviewed - No data to display  Imaging Review No results found.   EKG Interpretation None      MDM   Final diagnoses:  Herpes zoster    Switched from ultram to percocet and gabapentin   Teressa LowerVrinda Esmae Donathan, NP 08/17/14 1825  Richardean Canalavid H Yao, MD 08/17/14 [phone]

## 2014-08-17 NOTE — Discharge Instructions (Signed)

## 2014-08-17 NOTE — ED Notes (Signed)
Pt reports shingles under left breast. Continue to be painful at this time. No blisters noted. Unable to tolerate tramadol

## 2014-09-02 ENCOUNTER — Emergency Department (HOSPITAL_BASED_OUTPATIENT_CLINIC_OR_DEPARTMENT_OTHER)
Admission: EM | Admit: 2014-09-02 | Discharge: 2014-09-02 | Disposition: A | Discharge status: Home / Self Care | Payer: Medicare Other | Attending: Emergency Medicine | Admitting: Emergency Medicine

## 2014-09-02 ENCOUNTER — Encounter (HOSPITAL_BASED_OUTPATIENT_CLINIC_OR_DEPARTMENT_OTHER): Payer: Self-pay | Admitting: *Deleted

## 2014-09-02 DIAGNOSIS — E079 Disorder of thyroid, unspecified: Secondary | ICD-10-CM | POA: Diagnosis not present

## 2014-09-02 DIAGNOSIS — B0229 Other postherpetic nervous system involvement: Secondary | ICD-10-CM | POA: Insufficient documentation

## 2014-09-02 DIAGNOSIS — I10 Essential (primary) hypertension: Secondary | ICD-10-CM | POA: Diagnosis not present

## 2014-09-02 DIAGNOSIS — Z79899 Other long term (current) drug therapy: Secondary | ICD-10-CM | POA: Insufficient documentation

## 2014-09-02 DIAGNOSIS — Z792 Long term (current) use of antibiotics: Secondary | ICD-10-CM | POA: Insufficient documentation

## 2014-09-02 DIAGNOSIS — B029 Zoster without complications: Secondary | ICD-10-CM | POA: Diagnosis present

## 2014-09-02 DIAGNOSIS — Z7952 Long term (current) use of systemic steroids: Secondary | ICD-10-CM | POA: Insufficient documentation

## 2014-09-02 MED ORDER — HYDROCODONE-ACETAMINOPHEN 5-325 MG PO TABS: 1.0000 | ORAL_TABLET | Freq: Four times a day (QID) | ORAL | Status: AC | PRN

## 2014-09-02 NOTE — ED Notes (Signed)
Pt.  Reports Her L breast is so painful and she can hardly sleep at night.  Pt. Is reporting pain.

## 2014-09-02 NOTE — Discharge Instructions (Signed)
Hydrocodone as prescribed as needed for pain.  Continue your Neurontin as prescribed.  Follow-up with your primary Dr. if not improving in the next week.   Postherpetic Neuralgia Postherpetic neuralgia (PHN) is nerve pain that occurs after a shingles infection. Shingles is a painful rash that appears on one side of the body, usually on your trunk or face. Shingles is caused by the varicella-zoster virus. This is the same virus that causes chickenpox. In people who have had chickenpox, the virus can resurface years later and cause shingles. You may have PHN if you continue to have pain for 3 months after your shingles rash has gone away. PHN appears in the same area where you had the shingles rash. For most people, PHN goes away within 1 year.  Getting a vaccination for shingles can prevent PHN. This vaccine is recommended for people older than 50. It may prevent shingles and may also lower your risk of PHN if you do get shingles. CAUSES PHN is caused by damage to your nerves from the varicella-zoster virus. This damage makes your nerves overly sensitive.  RISK FACTORS Aging is the biggest risk factor for developing PHN. Most people who get PHN are older than 60. Other risk factors include:  Having very bad pain before your shingles rash starts.  Having a very bad rash.  Having shingles in the nerve that supplies your face and eye (trigeminal nerve). SIGNS AND SYMPTOMS Pain is the main symptom of PHN. The pain is often very bad and may be described as stabbing, burning, or feeling like an electric shock. The pain may come and go or may be there all the time. Pain may be triggered by light touches on the skin or changes in temperature. You may have itching along with the pain. DIAGNOSIS  Your health care provider may diagnose PHN based on your symptoms and your history of shingles. Lab studies and other diagnostic tests are usually not needed. TREATMENT  There is no cure for PHN. Treatment  for PHN will focus on pain relief. Over-the-counter pain relievers do not usually relieve PHN pain. You may need to work with a pain specialist. Treatment may include:  Antidepressant medicines to help with pain and improve sleep.  Antiseizure medicines to relieve nerve pain.  Strong pain relievers (opioids).  A numbing patch worn on the skin (lidocaine patch). HOME CARE INSTRUCTIONS It may take a long time to recover from PHN. Work closely with your health care provider, and have a good support system at home.   Take all medicines as directed by your health care provider.  Wear loose, comfortable clothing.  Cover sensitive areas with a dressing to reduce friction from clothing rubbing on the area.  If cold does not make your pain worse, try applying a cool compress or cooling gel pack to the area.  Talk to your health care provider if you feel depressed or desperate. Living with long-term pain can be depressing. SEEK MEDICAL CARE IF:  Your medicine is not helping.  You are struggling to manage your pain at home. Document Released: 06/08/2002 Document Revised: 08/02/2013 Document Reviewed: 03/09/2013 [facility] Patient Information 2015 [employer], [place]. This information is not intended to replace advice given to you by your health care provider. Make sure you discuss any questions you have with your health care provider.

## 2014-09-02 NOTE — ED Provider Notes (Signed)
CSN: [id number]     Arrival date & time 09/02/14  1552 History   First MD Initiated Contact with Patient 09/02/14 1603     Chief Complaint  Patient presents with  . Herpes Zoster     (Consider location/radiation/quality/duration/timing/severity/associated sxs/prior Treatment) HPI Comments: Patient is a 69 year old female diagnosed approximately one month ago with shingles to her left chest wall. She has been treated with multiple medications, however continues to have discomfort. The rashes have since healed. She denies any difficulty breathing, productive cough, or fever. She is currently taking Neurontin and is also taking Percocet on occasion as this does not agree with her.  The history is provided by the patient.    Past Medical History  Diagnosis Date  . Thyroid disease   . Hypertension    Past Surgical History  Procedure Laterality Date  . Breast surgery    . Abdominal hysterectomy     No family history on file. History  Substance Use Topics  . Smoking status: Never Smoker   . Smokeless tobacco: Not on file  . Alcohol Use: Yes   OB History    No data available     Review of Systems  All other systems reviewed and are negative.     Allergies  Codeine and Sulfa antibiotics  Home Medications   Prior to Admission medications   Medication Sig Start Date End Date Taking? Authorizing Provider  azithromycin (ZITHROMAX Z-PAK) 250 MG tablet 2 po day one, then 1 daily x 4 days 04/15/13   Geoffery Lyons, MD  chlorpheniramine-HYDROcodone [facility]) 10-8 MG/5ML LQCR Take 5 mLs by mouth every 12 (twelve) hours as needed for cough. 04/15/13   Geoffery Lyons, MD  gabapentin (NEURONTIN) 100 MG capsule Take 1 capsule (100 mg total) by mouth 3 (three) times daily. 08/17/14   Teressa Lower, NP  HYDROcodone-acetaminophen (NORCO) 5-325 MG per tablet Take 1-2 tablets by mouth every 6 (six) hours as needed. 09/02/14   Geoffery Lyons, MD  Levothyroxine Sodium (SYNTHROID PO)  Take by mouth.    Historical Provider, MD  oxyCODONE-acetaminophen (PERCOCET/ROXICET) 5-325 MG per tablet Take 1-2 tablets by mouth every 8 (eight) hours as needed for severe pain. 08/17/14   Teressa Lower, NP  predniSONE (DELTASONE) 10 MG tablet Take 2 tablets (20 mg total) by mouth 2 (two) times daily. 04/15/13   Geoffery Lyons, MD   BP 180/98 mmHg  Pulse 113  Temp(Src) 98.3 F (36.8 C) (Oral)  Resp 18  Ht  (1.727 m)  Wt 178 lb (80.74 kg)  BMI 27.07 kg/m2  SpO2 99% Physical Exam  Constitutional: She is oriented to person, place, and time. She appears well-developed and well-nourished. No distress.  HENT:  Head: Normocephalic and atraumatic.  Neck: Normal range of motion. Neck supple.  Cardiovascular: Normal rate, regular rhythm and normal heart sounds.   No murmur heard. Pulmonary/Chest: Effort normal and breath sounds normal. No respiratory distress. She has no wheezes.  Neurological: She is alert and oriented to person, place, and time.  Skin: Skin is warm and dry. Rash noted. She is not diaphoretic.  There is darkened skin present under the left breast and extending around into the thoracic back. There are no open vesicles.  Nursing note and vitals reviewed.   ED Course  Procedures (including critical care time) Labs Review Labs Reviewed - No data to display  Imaging Review No results found.   EKG Interpretation None      MDM   Final diagnoses:  Postherpetic neuralgia    Patient appears to have a postherpetic neuralgia. I will recommend she continue her Neurontin and prescribe hydrocodone as her Percocet does not seem to agree with her. She is follow-up with her primary Dr. to discuss referral for possible nerve block if symptoms do not improve.    Geoffery Lyonsouglas Abdulraheem Pineo, MD 09/02/14 [phone]

## 2014-09-09 DIAGNOSIS — B029 Zoster without complications: Secondary | ICD-10-CM | POA: Diagnosis not present

## 2014-09-20 DIAGNOSIS — E119 Type 2 diabetes mellitus without complications: Secondary | ICD-10-CM | POA: Diagnosis not present

## 2014-09-20 DIAGNOSIS — E785 Hyperlipidemia, unspecified: Secondary | ICD-10-CM | POA: Diagnosis not present

## 2014-09-20 DIAGNOSIS — E039 Hypothyroidism, unspecified: Secondary | ICD-10-CM | POA: Diagnosis not present

## 2014-09-20 DIAGNOSIS — B0229 Other postherpetic nervous system involvement: Secondary | ICD-10-CM | POA: Diagnosis not present

## 2014-09-20 DIAGNOSIS — R030 Elevated blood-pressure reading, without diagnosis of hypertension: Secondary | ICD-10-CM | POA: Diagnosis not present

## 2014-09-20 DIAGNOSIS — Z6829 Body mass index (BMI) 29.0-29.9, adult: Secondary | ICD-10-CM | POA: Diagnosis not present

## 2014-09-20 DIAGNOSIS — Z1389 Encounter for screening for other disorder: Secondary | ICD-10-CM | POA: Diagnosis not present

## 2014-10-04 DIAGNOSIS — B0223 Postherpetic polyneuropathy: Secondary | ICD-10-CM | POA: Diagnosis not present

## 2014-10-04 DIAGNOSIS — Z1231 Encounter for screening mammogram for malignant neoplasm of breast: Secondary | ICD-10-CM | POA: Diagnosis not present

## 2014-12-23 DIAGNOSIS — R030 Elevated blood-pressure reading, without diagnosis of hypertension: Secondary | ICD-10-CM | POA: Diagnosis not present

## 2014-12-23 DIAGNOSIS — F419 Anxiety disorder, unspecified: Secondary | ICD-10-CM | POA: Diagnosis not present

## 2014-12-23 DIAGNOSIS — E039 Hypothyroidism, unspecified: Secondary | ICD-10-CM | POA: Diagnosis not present

## 2014-12-23 DIAGNOSIS — Z6829 Body mass index (BMI) 29.0-29.9, adult: Secondary | ICD-10-CM | POA: Diagnosis not present

## 2014-12-23 DIAGNOSIS — E119 Type 2 diabetes mellitus without complications: Secondary | ICD-10-CM | POA: Diagnosis not present

## 2014-12-23 DIAGNOSIS — B0229 Other postherpetic nervous system involvement: Secondary | ICD-10-CM | POA: Diagnosis not present

## 2015-09-12 DIAGNOSIS — E119 Type 2 diabetes mellitus without complications: Secondary | ICD-10-CM | POA: Diagnosis not present

## 2015-09-12 DIAGNOSIS — E038 Other specified hypothyroidism: Secondary | ICD-10-CM | POA: Diagnosis not present

## 2015-09-12 DIAGNOSIS — I10 Essential (primary) hypertension: Secondary | ICD-10-CM | POA: Diagnosis not present

## 2015-09-12 DIAGNOSIS — Z6828 Body mass index (BMI) 28.0-28.9, adult: Secondary | ICD-10-CM | POA: Diagnosis not present

## 2015-09-12 DIAGNOSIS — E784 Other hyperlipidemia: Secondary | ICD-10-CM | POA: Diagnosis not present

## 2015-10-23 DIAGNOSIS — Z1231 Encounter for screening mammogram for malignant neoplasm of breast: Secondary | ICD-10-CM | POA: Diagnosis not present

## 2015-10-23 DIAGNOSIS — Z1289 Encounter for screening for malignant neoplasm of other sites: Secondary | ICD-10-CM | POA: Diagnosis not present

## 2016-01-16 DIAGNOSIS — I10 Essential (primary) hypertension: Secondary | ICD-10-CM | POA: Diagnosis not present

## 2016-01-16 DIAGNOSIS — E784 Other hyperlipidemia: Secondary | ICD-10-CM | POA: Diagnosis not present

## 2016-01-16 DIAGNOSIS — F418 Other specified anxiety disorders: Secondary | ICD-10-CM | POA: Diagnosis not present

## 2016-01-16 DIAGNOSIS — Z1389 Encounter for screening for other disorder: Secondary | ICD-10-CM | POA: Diagnosis not present

## 2016-01-16 DIAGNOSIS — E038 Other specified hypothyroidism: Secondary | ICD-10-CM | POA: Diagnosis not present

## 2016-01-16 DIAGNOSIS — Z6827 Body mass index (BMI) 27.0-27.9, adult: Secondary | ICD-10-CM | POA: Diagnosis not present

## 2016-01-16 DIAGNOSIS — E119 Type 2 diabetes mellitus without complications: Secondary | ICD-10-CM | POA: Diagnosis not present

## 2016-05-14 DIAGNOSIS — E119 Type 2 diabetes mellitus without complications: Secondary | ICD-10-CM | POA: Diagnosis not present

## 2016-05-14 DIAGNOSIS — I10 Essential (primary) hypertension: Secondary | ICD-10-CM | POA: Diagnosis not present

## 2016-05-14 DIAGNOSIS — Z6828 Body mass index (BMI) 28.0-28.9, adult: Secondary | ICD-10-CM | POA: Diagnosis not present

## 2016-05-14 DIAGNOSIS — E784 Other hyperlipidemia: Secondary | ICD-10-CM | POA: Diagnosis not present

## 2016-05-14 DIAGNOSIS — E039 Hypothyroidism, unspecified: Secondary | ICD-10-CM | POA: Diagnosis not present

## 2016-10-09 DIAGNOSIS — I10 Essential (primary) hypertension: Secondary | ICD-10-CM | POA: Diagnosis not present

## 2016-10-09 DIAGNOSIS — Z1389 Encounter for screening for other disorder: Secondary | ICD-10-CM | POA: Diagnosis not present

## 2016-10-09 DIAGNOSIS — Z6829 Body mass index (BMI) 29.0-29.9, adult: Secondary | ICD-10-CM | POA: Diagnosis not present

## 2016-10-09 DIAGNOSIS — E038 Other specified hypothyroidism: Secondary | ICD-10-CM | POA: Diagnosis not present

## 2016-10-09 DIAGNOSIS — E119 Type 2 diabetes mellitus without complications: Secondary | ICD-10-CM | POA: Diagnosis not present

## 2016-10-09 DIAGNOSIS — E784 Other hyperlipidemia: Secondary | ICD-10-CM | POA: Diagnosis not present

## 2016-10-09 DIAGNOSIS — F418 Other specified anxiety disorders: Secondary | ICD-10-CM | POA: Diagnosis not present

## 2016-10-11 DIAGNOSIS — E038 Other specified hypothyroidism: Secondary | ICD-10-CM | POA: Diagnosis not present

## 2016-10-11 DIAGNOSIS — E784 Other hyperlipidemia: Secondary | ICD-10-CM | POA: Diagnosis not present

## 2016-10-11 DIAGNOSIS — I10 Essential (primary) hypertension: Secondary | ICD-10-CM | POA: Diagnosis not present

## 2016-10-11 DIAGNOSIS — M2550 Pain in unspecified joint: Secondary | ICD-10-CM | POA: Diagnosis not present

## 2016-11-04 DIAGNOSIS — Z1231 Encounter for screening mammogram for malignant neoplasm of breast: Secondary | ICD-10-CM | POA: Diagnosis not present

## 2017-02-11 DIAGNOSIS — F418 Other specified anxiety disorders: Secondary | ICD-10-CM | POA: Diagnosis not present

## 2017-02-11 DIAGNOSIS — Z1389 Encounter for screening for other disorder: Secondary | ICD-10-CM | POA: Diagnosis not present

## 2017-02-11 DIAGNOSIS — E119 Type 2 diabetes mellitus without complications: Secondary | ICD-10-CM | POA: Diagnosis not present

## 2017-02-11 DIAGNOSIS — E038 Other specified hypothyroidism: Secondary | ICD-10-CM | POA: Diagnosis not present

## 2017-02-11 DIAGNOSIS — E7849 Other hyperlipidemia: Secondary | ICD-10-CM | POA: Diagnosis not present

## 2017-02-11 DIAGNOSIS — Z6828 Body mass index (BMI) 28.0-28.9, adult: Secondary | ICD-10-CM | POA: Diagnosis not present

## 2017-02-11 DIAGNOSIS — I10 Essential (primary) hypertension: Secondary | ICD-10-CM | POA: Diagnosis not present

## 2017-06-16 DIAGNOSIS — E038 Other specified hypothyroidism: Secondary | ICD-10-CM | POA: Diagnosis not present

## 2017-06-16 DIAGNOSIS — E7849 Other hyperlipidemia: Secondary | ICD-10-CM | POA: Diagnosis not present

## 2017-06-16 DIAGNOSIS — F418 Other specified anxiety disorders: Secondary | ICD-10-CM | POA: Diagnosis not present

## 2017-06-16 DIAGNOSIS — I10 Essential (primary) hypertension: Secondary | ICD-10-CM | POA: Diagnosis not present

## 2017-06-16 DIAGNOSIS — E119 Type 2 diabetes mellitus without complications: Secondary | ICD-10-CM | POA: Diagnosis not present

## 2017-06-16 DIAGNOSIS — Z1389 Encounter for screening for other disorder: Secondary | ICD-10-CM | POA: Diagnosis not present

## 2017-06-16 DIAGNOSIS — Z6827 Body mass index (BMI) 27.0-27.9, adult: Secondary | ICD-10-CM | POA: Diagnosis not present

## 2017-06-17 ENCOUNTER — Emergency Department (HOSPITAL_COMMUNITY)
Admission: EM | Admit: 2017-06-17 | Discharge: 2017-06-18 | Disposition: A | Discharge status: Home / Self Care | Payer: Medicare Other | Attending: Emergency Medicine | Admitting: Emergency Medicine

## 2017-06-17 ENCOUNTER — Encounter (HOSPITAL_COMMUNITY): Payer: Self-pay | Admitting: Emergency Medicine

## 2017-06-17 ENCOUNTER — Emergency Department (HOSPITAL_COMMUNITY): Payer: Medicare Other

## 2017-06-17 DIAGNOSIS — S299XXA Unspecified injury of thorax, initial encounter: Secondary | ICD-10-CM | POA: Diagnosis not present

## 2017-06-17 DIAGNOSIS — I10 Essential (primary) hypertension: Secondary | ICD-10-CM | POA: Insufficient documentation

## 2017-06-17 DIAGNOSIS — Z23 Encounter for immunization: Secondary | ICD-10-CM | POA: Diagnosis not present

## 2017-06-17 DIAGNOSIS — Y999 Unspecified external cause status: Secondary | ICD-10-CM | POA: Insufficient documentation

## 2017-06-17 DIAGNOSIS — E039 Hypothyroidism, unspecified: Secondary | ICD-10-CM | POA: Insufficient documentation

## 2017-06-17 DIAGNOSIS — Z79899 Other long term (current) drug therapy: Secondary | ICD-10-CM | POA: Diagnosis not present

## 2017-06-17 DIAGNOSIS — M79652 Pain in left thigh: Secondary | ICD-10-CM | POA: Insufficient documentation

## 2017-06-17 DIAGNOSIS — M791 Myalgia, unspecified site: Secondary | ICD-10-CM

## 2017-06-17 DIAGNOSIS — R079 Chest pain, unspecified: Secondary | ICD-10-CM | POA: Diagnosis not present

## 2017-06-17 DIAGNOSIS — S79922A Unspecified injury of left thigh, initial encounter: Secondary | ICD-10-CM | POA: Diagnosis not present

## 2017-06-17 DIAGNOSIS — Y939 Activity, unspecified: Secondary | ICD-10-CM | POA: Diagnosis not present

## 2017-06-17 DIAGNOSIS — Y92410 Unspecified street and highway as the place of occurrence of the external cause: Secondary | ICD-10-CM | POA: Diagnosis not present

## 2017-06-17 MED ORDER — TETANUS-DIPHTH-ACELL PERTUSSIS 5-2.5-18.5 LF-MCG/0.5 IM SUSP
0.5000 mL | Freq: Once | INTRAMUSCULAR | Status: AC
  Administered 2017-06-17: 0.5 mL via INTRAMUSCULAR
  Filled 2017-06-17: qty 0.5

## 2017-06-17 NOTE — ED Notes (Signed)
Patient transported to X-ray 
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
## 2017-06-18 NOTE — ED Notes (Signed)
Pt departed in NAD. Went upstairs with husband, who was admitted to [facility].

## 2017-09-26 DIAGNOSIS — M545 Low back pain: Secondary | ICD-10-CM | POA: Diagnosis not present

## 2017-09-26 DIAGNOSIS — M542 Cervicalgia: Secondary | ICD-10-CM | POA: Diagnosis not present

## 2017-10-27 DIAGNOSIS — E038 Other specified hypothyroidism: Secondary | ICD-10-CM | POA: Diagnosis not present

## 2017-10-27 DIAGNOSIS — E119 Type 2 diabetes mellitus without complications: Secondary | ICD-10-CM | POA: Diagnosis not present

## 2017-10-27 DIAGNOSIS — F418 Other specified anxiety disorders: Secondary | ICD-10-CM | POA: Diagnosis not present

## 2017-10-27 DIAGNOSIS — Z6827 Body mass index (BMI) 27.0-27.9, adult: Secondary | ICD-10-CM | POA: Diagnosis not present

## 2017-10-27 DIAGNOSIS — I10 Essential (primary) hypertension: Secondary | ICD-10-CM | POA: Diagnosis not present

## 2017-10-27 DIAGNOSIS — E7849 Other hyperlipidemia: Secondary | ICD-10-CM | POA: Diagnosis not present

## 2017-11-10 DIAGNOSIS — Z1231 Encounter for screening mammogram for malignant neoplasm of breast: Secondary | ICD-10-CM | POA: Diagnosis not present

## 2017-11-10 DIAGNOSIS — Z01419 Encounter for gynecological examination (general) (routine) without abnormal findings: Secondary | ICD-10-CM | POA: Diagnosis not present

## 2017-11-11 DIAGNOSIS — M542 Cervicalgia: Secondary | ICD-10-CM | POA: Diagnosis not present

## 2017-11-11 DIAGNOSIS — M545 Low back pain: Secondary | ICD-10-CM | POA: Diagnosis not present

## 2017-11-19 DIAGNOSIS — M47896 Other spondylosis, lumbar region: Secondary | ICD-10-CM | POA: Diagnosis not present

## 2017-11-19 DIAGNOSIS — M47892 Other spondylosis, cervical region: Secondary | ICD-10-CM | POA: Diagnosis not present

## 2017-11-19 DIAGNOSIS — S134XXD Sprain of ligaments of cervical spine, subsequent encounter: Secondary | ICD-10-CM | POA: Diagnosis not present

## 2017-12-15 DIAGNOSIS — S134XXD Sprain of ligaments of cervical spine, subsequent encounter: Secondary | ICD-10-CM | POA: Diagnosis not present

## 2017-12-15 DIAGNOSIS — M47896 Other spondylosis, lumbar region: Secondary | ICD-10-CM | POA: Diagnosis not present

## 2017-12-15 DIAGNOSIS — M47892 Other spondylosis, cervical region: Secondary | ICD-10-CM | POA: Diagnosis not present

## 2017-12-18 DIAGNOSIS — S134XXD Sprain of ligaments of cervical spine, subsequent encounter: Secondary | ICD-10-CM | POA: Diagnosis not present

## 2017-12-18 DIAGNOSIS — M47896 Other spondylosis, lumbar region: Secondary | ICD-10-CM | POA: Diagnosis not present

## 2017-12-18 DIAGNOSIS — M47892 Other spondylosis, cervical region: Secondary | ICD-10-CM | POA: Diagnosis not present

## 2017-12-22 DIAGNOSIS — M47896 Other spondylosis, lumbar region: Secondary | ICD-10-CM | POA: Diagnosis not present

## 2017-12-22 DIAGNOSIS — S134XXD Sprain of ligaments of cervical spine, subsequent encounter: Secondary | ICD-10-CM | POA: Diagnosis not present

## 2017-12-22 DIAGNOSIS — M47892 Other spondylosis, cervical region: Secondary | ICD-10-CM | POA: Diagnosis not present

## 2017-12-25 DIAGNOSIS — M47892 Other spondylosis, cervical region: Secondary | ICD-10-CM | POA: Diagnosis not present

## 2017-12-25 DIAGNOSIS — S134XXD Sprain of ligaments of cervical spine, subsequent encounter: Secondary | ICD-10-CM | POA: Diagnosis not present

## 2017-12-25 DIAGNOSIS — M47896 Other spondylosis, lumbar region: Secondary | ICD-10-CM | POA: Diagnosis not present

## 2017-12-29 DIAGNOSIS — M47892 Other spondylosis, cervical region: Secondary | ICD-10-CM | POA: Diagnosis not present

## 2017-12-29 DIAGNOSIS — S134XXD Sprain of ligaments of cervical spine, subsequent encounter: Secondary | ICD-10-CM | POA: Diagnosis not present

## 2017-12-29 DIAGNOSIS — M47896 Other spondylosis, lumbar region: Secondary | ICD-10-CM | POA: Diagnosis not present

## 2017-12-31 DIAGNOSIS — S134XXD Sprain of ligaments of cervical spine, subsequent encounter: Secondary | ICD-10-CM | POA: Diagnosis not present

## 2017-12-31 DIAGNOSIS — M47892 Other spondylosis, cervical region: Secondary | ICD-10-CM | POA: Diagnosis not present

## 2017-12-31 DIAGNOSIS — M47896 Other spondylosis, lumbar region: Secondary | ICD-10-CM | POA: Diagnosis not present

## 2018-01-06 DIAGNOSIS — S134XXD Sprain of ligaments of cervical spine, subsequent encounter: Secondary | ICD-10-CM | POA: Diagnosis not present

## 2018-01-06 DIAGNOSIS — M47892 Other spondylosis, cervical region: Secondary | ICD-10-CM | POA: Diagnosis not present

## 2018-01-06 DIAGNOSIS — M47896 Other spondylosis, lumbar region: Secondary | ICD-10-CM | POA: Diagnosis not present

## 2018-01-15 DIAGNOSIS — S134XXD Sprain of ligaments of cervical spine, subsequent encounter: Secondary | ICD-10-CM | POA: Diagnosis not present

## 2018-01-15 DIAGNOSIS — M47892 Other spondylosis, cervical region: Secondary | ICD-10-CM | POA: Diagnosis not present

## 2018-01-15 DIAGNOSIS — M47896 Other spondylosis, lumbar region: Secondary | ICD-10-CM | POA: Diagnosis not present

## 2018-01-19 DIAGNOSIS — M47892 Other spondylosis, cervical region: Secondary | ICD-10-CM | POA: Diagnosis not present

## 2018-01-19 DIAGNOSIS — M47896 Other spondylosis, lumbar region: Secondary | ICD-10-CM | POA: Diagnosis not present

## 2018-01-19 DIAGNOSIS — S134XXD Sprain of ligaments of cervical spine, subsequent encounter: Secondary | ICD-10-CM | POA: Diagnosis not present

## 2018-01-21 DIAGNOSIS — M47892 Other spondylosis, cervical region: Secondary | ICD-10-CM | POA: Diagnosis not present

## 2018-01-21 DIAGNOSIS — S134XXD Sprain of ligaments of cervical spine, subsequent encounter: Secondary | ICD-10-CM | POA: Diagnosis not present

## 2018-01-21 DIAGNOSIS — M47896 Other spondylosis, lumbar region: Secondary | ICD-10-CM | POA: Diagnosis not present

## 2018-01-26 DIAGNOSIS — M47892 Other spondylosis, cervical region: Secondary | ICD-10-CM | POA: Diagnosis not present

## 2018-01-26 DIAGNOSIS — M47896 Other spondylosis, lumbar region: Secondary | ICD-10-CM | POA: Diagnosis not present

## 2018-01-26 DIAGNOSIS — S134XXD Sprain of ligaments of cervical spine, subsequent encounter: Secondary | ICD-10-CM | POA: Diagnosis not present

## 2018-01-28 DIAGNOSIS — M47896 Other spondylosis, lumbar region: Secondary | ICD-10-CM | POA: Diagnosis not present

## 2018-01-28 DIAGNOSIS — S134XXD Sprain of ligaments of cervical spine, subsequent encounter: Secondary | ICD-10-CM | POA: Diagnosis not present

## 2018-01-28 DIAGNOSIS — M47892 Other spondylosis, cervical region: Secondary | ICD-10-CM | POA: Diagnosis not present

## 2018-02-02 DIAGNOSIS — M47896 Other spondylosis, lumbar region: Secondary | ICD-10-CM | POA: Diagnosis not present

## 2018-02-02 DIAGNOSIS — S134XXD Sprain of ligaments of cervical spine, subsequent encounter: Secondary | ICD-10-CM | POA: Diagnosis not present

## 2018-02-02 DIAGNOSIS — M47892 Other spondylosis, cervical region: Secondary | ICD-10-CM | POA: Diagnosis not present

## 2018-02-16 DIAGNOSIS — I10 Essential (primary) hypertension: Secondary | ICD-10-CM | POA: Diagnosis not present

## 2018-02-16 DIAGNOSIS — Z6827 Body mass index (BMI) 27.0-27.9, adult: Secondary | ICD-10-CM | POA: Diagnosis not present

## 2018-02-16 DIAGNOSIS — E119 Type 2 diabetes mellitus without complications: Secondary | ICD-10-CM | POA: Diagnosis not present

## 2018-02-16 DIAGNOSIS — E7849 Other hyperlipidemia: Secondary | ICD-10-CM | POA: Diagnosis not present

## 2018-02-16 DIAGNOSIS — E038 Other specified hypothyroidism: Secondary | ICD-10-CM | POA: Diagnosis not present

## 2018-02-16 DIAGNOSIS — F418 Other specified anxiety disorders: Secondary | ICD-10-CM | POA: Diagnosis not present
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
## 2018-11-18 DIAGNOSIS — E119 Type 2 diabetes mellitus without complications: Secondary | ICD-10-CM | POA: Diagnosis not present

## 2018-11-18 DIAGNOSIS — E785 Hyperlipidemia, unspecified: Secondary | ICD-10-CM | POA: Diagnosis not present

## 2018-11-18 DIAGNOSIS — E039 Hypothyroidism, unspecified: Secondary | ICD-10-CM | POA: Diagnosis not present

## 2018-11-18 DIAGNOSIS — I10 Essential (primary) hypertension: Secondary | ICD-10-CM | POA: Diagnosis not present

## 2018-11-18 DIAGNOSIS — Z1331 Encounter for screening for depression: Secondary | ICD-10-CM | POA: Diagnosis not present

## 2018-11-18 DIAGNOSIS — F419 Anxiety disorder, unspecified: Secondary | ICD-10-CM | POA: Diagnosis not present

## 2018-11-18 DIAGNOSIS — Z1339 Encounter for screening examination for other mental health and behavioral disorders: Secondary | ICD-10-CM | POA: Diagnosis not present

## 2019-02-05 DIAGNOSIS — Z1231 Encounter for screening mammogram for malignant neoplasm of breast: Secondary | ICD-10-CM | POA: Diagnosis not present

## 2019-03-08 DIAGNOSIS — E038 Other specified hypothyroidism: Secondary | ICD-10-CM | POA: Diagnosis not present

## 2019-03-08 DIAGNOSIS — E7849 Other hyperlipidemia: Secondary | ICD-10-CM | POA: Diagnosis not present

## 2019-03-08 DIAGNOSIS — I10 Essential (primary) hypertension: Secondary | ICD-10-CM | POA: Diagnosis not present

## 2019-03-10 DIAGNOSIS — F419 Anxiety disorder, unspecified: Secondary | ICD-10-CM | POA: Diagnosis not present

## 2019-03-10 DIAGNOSIS — I10 Essential (primary) hypertension: Secondary | ICD-10-CM | POA: Diagnosis not present

## 2019-03-10 DIAGNOSIS — E119 Type 2 diabetes mellitus without complications: Secondary | ICD-10-CM | POA: Diagnosis not present

## 2019-03-10 DIAGNOSIS — E039 Hypothyroidism, unspecified: Secondary | ICD-10-CM | POA: Diagnosis not present

## 2019-03-10 DIAGNOSIS — E785 Hyperlipidemia, unspecified: Secondary | ICD-10-CM | POA: Diagnosis not present

## 2019-05-13 DIAGNOSIS — Z23 Encounter for immunization: Secondary | ICD-10-CM | POA: Diagnosis not present

## 2019-06-07 DIAGNOSIS — S0500XA Injury of conjunctiva and corneal abrasion without foreign body, unspecified eye, initial encounter: Secondary | ICD-10-CM | POA: Diagnosis not present

## 2019-06-10 DIAGNOSIS — Z23 Encounter for immunization: Secondary | ICD-10-CM | POA: Diagnosis not present

## 2019-07-05 DIAGNOSIS — N3001 Acute cystitis with hematuria: Secondary | ICD-10-CM | POA: Diagnosis not present

## 2019-07-05 DIAGNOSIS — Z131 Encounter for screening for diabetes mellitus: Secondary | ICD-10-CM | POA: Diagnosis not present

## 2019-07-12 DIAGNOSIS — E038 Other specified hypothyroidism: Secondary | ICD-10-CM | POA: Diagnosis not present

## 2019-07-12 DIAGNOSIS — F419 Anxiety disorder, unspecified: Secondary | ICD-10-CM | POA: Diagnosis not present

## 2019-07-12 DIAGNOSIS — E119 Type 2 diabetes mellitus without complications: Secondary | ICD-10-CM | POA: Diagnosis not present

## 2019-07-12 DIAGNOSIS — I10 Essential (primary) hypertension: Secondary | ICD-10-CM | POA: Diagnosis not present

## 2019-07-12 DIAGNOSIS — E7849 Other hyperlipidemia: Secondary | ICD-10-CM | POA: Diagnosis not present

## 2019-12-31 DIAGNOSIS — E119 Type 2 diabetes mellitus without complications: Secondary | ICD-10-CM | POA: Diagnosis not present

## 2019-12-31 DIAGNOSIS — E785 Hyperlipidemia, unspecified: Secondary | ICD-10-CM | POA: Diagnosis not present

## 2019-12-31 DIAGNOSIS — Z0000 Encounter for general adult medical examination without abnormal findings: Secondary | ICD-10-CM | POA: Diagnosis not present

## 2019-12-31 DIAGNOSIS — I10 Essential (primary) hypertension: Secondary | ICD-10-CM | POA: Diagnosis not present

## 2019-12-31 DIAGNOSIS — E039 Hypothyroidism, unspecified: Secondary | ICD-10-CM | POA: Diagnosis not present

## 2019-12-31 DIAGNOSIS — F419 Anxiety disorder, unspecified: Secondary | ICD-10-CM | POA: Diagnosis not present

## 2019-12-31 DIAGNOSIS — B0229 Other postherpetic nervous system involvement: Secondary | ICD-10-CM | POA: Diagnosis not present

## 2020-02-15 ENCOUNTER — Other Ambulatory Visit (HOSPITAL_COMMUNITY): Payer: Self-pay | Admitting: Obstetrics and Gynecology

## 2020-02-15 DIAGNOSIS — Z124 Encounter for screening for malignant neoplasm of cervix: Secondary | ICD-10-CM | POA: Diagnosis not present

## 2020-02-15 DIAGNOSIS — Z1231 Encounter for screening mammogram for malignant neoplasm of breast: Secondary | ICD-10-CM | POA: Diagnosis not present

## 2020-02-29 DIAGNOSIS — Z23 Encounter for immunization: Secondary | ICD-10-CM | POA: Diagnosis not present

## 2020-05-25 DIAGNOSIS — F419 Anxiety disorder, unspecified: Secondary | ICD-10-CM | POA: Diagnosis not present

## 2020-05-25 DIAGNOSIS — E785 Hyperlipidemia, unspecified: Secondary | ICD-10-CM | POA: Diagnosis not present

## 2020-05-25 DIAGNOSIS — B0229 Other postherpetic nervous system involvement: Secondary | ICD-10-CM | POA: Diagnosis not present

## 2020-05-25 DIAGNOSIS — I10 Essential (primary) hypertension: Secondary | ICD-10-CM | POA: Diagnosis not present

## 2020-05-25 DIAGNOSIS — E039 Hypothyroidism, unspecified: Secondary | ICD-10-CM | POA: Diagnosis not present

## 2020-05-25 DIAGNOSIS — E119 Type 2 diabetes mellitus without complications: Secondary | ICD-10-CM | POA: Diagnosis not present

## 2020-09-26 DIAGNOSIS — E039 Hypothyroidism, unspecified: Secondary | ICD-10-CM | POA: Diagnosis not present

## 2020-09-26 DIAGNOSIS — E119 Type 2 diabetes mellitus without complications: Secondary | ICD-10-CM | POA: Diagnosis not present

## 2020-09-26 DIAGNOSIS — E785 Hyperlipidemia, unspecified: Secondary | ICD-10-CM | POA: Diagnosis not present

## 2020-09-26 DIAGNOSIS — B0229 Other postherpetic nervous system involvement: Secondary | ICD-10-CM | POA: Diagnosis not present

## 2020-09-26 DIAGNOSIS — I10 Essential (primary) hypertension: Secondary | ICD-10-CM | POA: Diagnosis not present

## 2020-09-26 DIAGNOSIS — F419 Anxiety disorder, unspecified: Secondary | ICD-10-CM | POA: Diagnosis not present

## 2020-09-28 ENCOUNTER — Other Ambulatory Visit (HOSPITAL_COMMUNITY): Payer: Self-pay

## 2020-10-04 ENCOUNTER — Other Ambulatory Visit (HOSPITAL_COMMUNITY): Payer: Self-pay

## 2020-11-06 DIAGNOSIS — F419 Anxiety disorder, unspecified: Secondary | ICD-10-CM | POA: Diagnosis not present

## 2020-11-06 DIAGNOSIS — E119 Type 2 diabetes mellitus without complications: Secondary | ICD-10-CM | POA: Diagnosis not present

## 2020-11-06 DIAGNOSIS — B0229 Other postherpetic nervous system involvement: Secondary | ICD-10-CM | POA: Diagnosis not present

## 2020-11-06 DIAGNOSIS — E785 Hyperlipidemia, unspecified: Secondary | ICD-10-CM | POA: Diagnosis not present

## 2020-11-06 DIAGNOSIS — E039 Hypothyroidism, unspecified: Secondary | ICD-10-CM | POA: Diagnosis not present

## 2020-11-06 DIAGNOSIS — I10 Essential (primary) hypertension: Secondary | ICD-10-CM | POA: Diagnosis not present

## 2020-12-25 DIAGNOSIS — E039 Hypothyroidism, unspecified: Secondary | ICD-10-CM | POA: Diagnosis not present

## 2020-12-25 DIAGNOSIS — E119 Type 2 diabetes mellitus without complications: Secondary | ICD-10-CM | POA: Diagnosis not present

## 2020-12-25 DIAGNOSIS — E785 Hyperlipidemia, unspecified: Secondary | ICD-10-CM | POA: Diagnosis not present

## 2021-01-01 DIAGNOSIS — E039 Hypothyroidism, unspecified: Secondary | ICD-10-CM | POA: Diagnosis not present

## 2021-01-01 DIAGNOSIS — Z1212 Encounter for screening for malignant neoplasm of rectum: Secondary | ICD-10-CM | POA: Diagnosis not present

## 2021-01-01 DIAGNOSIS — F419 Anxiety disorder, unspecified: Secondary | ICD-10-CM | POA: Diagnosis not present

## 2021-01-01 DIAGNOSIS — Z1331 Encounter for screening for depression: Secondary | ICD-10-CM | POA: Diagnosis not present

## 2021-01-01 DIAGNOSIS — Z0000 Encounter for general adult medical examination without abnormal findings: Secondary | ICD-10-CM | POA: Diagnosis not present

## 2021-01-01 DIAGNOSIS — E785 Hyperlipidemia, unspecified: Secondary | ICD-10-CM | POA: Diagnosis not present

## 2021-01-01 DIAGNOSIS — R8290 Unspecified abnormal findings in urine: Secondary | ICD-10-CM | POA: Diagnosis not present

## 2021-01-01 DIAGNOSIS — Z1389 Encounter for screening for other disorder: Secondary | ICD-10-CM | POA: Diagnosis not present

## 2021-01-01 DIAGNOSIS — E119 Type 2 diabetes mellitus without complications: Secondary | ICD-10-CM | POA: Diagnosis not present

## 2021-01-01 DIAGNOSIS — I10 Essential (primary) hypertension: Secondary | ICD-10-CM | POA: Diagnosis not present

## 2021-02-16 DIAGNOSIS — Z1231 Encounter for screening mammogram for malignant neoplasm of breast: Secondary | ICD-10-CM | POA: Diagnosis not present

## 2021-05-08 DIAGNOSIS — E785 Hyperlipidemia, unspecified: Secondary | ICD-10-CM | POA: Diagnosis not present

## 2021-05-08 DIAGNOSIS — E119 Type 2 diabetes mellitus without complications: Secondary | ICD-10-CM | POA: Diagnosis not present

## 2021-05-08 DIAGNOSIS — I10 Essential (primary) hypertension: Secondary | ICD-10-CM | POA: Diagnosis not present

## 2021-05-08 DIAGNOSIS — E039 Hypothyroidism, unspecified: Secondary | ICD-10-CM | POA: Diagnosis not present

## 2021-05-08 DIAGNOSIS — F419 Anxiety disorder, unspecified: Secondary | ICD-10-CM | POA: Diagnosis not present

## 2021-05-21 DIAGNOSIS — R051 Acute cough: Secondary | ICD-10-CM | POA: Diagnosis not present

## 2021-09-13 DIAGNOSIS — E039 Hypothyroidism, unspecified: Secondary | ICD-10-CM | POA: Diagnosis not present

## 2021-09-13 DIAGNOSIS — I10 Essential (primary) hypertension: Secondary | ICD-10-CM | POA: Diagnosis not present

## 2021-09-13 DIAGNOSIS — E119 Type 2 diabetes mellitus without complications: Secondary | ICD-10-CM | POA: Diagnosis not present

## 2021-09-13 DIAGNOSIS — F419 Anxiety disorder, unspecified: Secondary | ICD-10-CM | POA: Diagnosis not present

## 2021-09-13 DIAGNOSIS — E785 Hyperlipidemia, unspecified: Secondary | ICD-10-CM | POA: Diagnosis not present

## 2021-09-13 DIAGNOSIS — B0229 Other postherpetic nervous system involvement: Secondary | ICD-10-CM | POA: Diagnosis not present

## 2022-02-25 DIAGNOSIS — Z1231 Encounter for screening mammogram for malignant neoplasm of breast: Secondary | ICD-10-CM | POA: Diagnosis not present

## 2022-02-25 DIAGNOSIS — R87620 Atypical squamous cells of undetermined significance on cytologic smear of vagina (ASC-US): Secondary | ICD-10-CM | POA: Diagnosis not present

## 2022-02-25 DIAGNOSIS — Z01419 Encounter for gynecological examination (general) (routine) without abnormal findings: Secondary | ICD-10-CM | POA: Diagnosis not present

## 2022-02-25 DIAGNOSIS — Z1272 Encounter for screening for malignant neoplasm of vagina: Secondary | ICD-10-CM | POA: Diagnosis not present

## 2022-11-26 DIAGNOSIS — E039 Hypothyroidism, unspecified: Secondary | ICD-10-CM | POA: Diagnosis not present

## 2022-11-26 DIAGNOSIS — F419 Anxiety disorder, unspecified: Secondary | ICD-10-CM | POA: Diagnosis not present

## 2022-11-26 DIAGNOSIS — E119 Type 2 diabetes mellitus without complications: Secondary | ICD-10-CM | POA: Diagnosis not present

## 2022-11-26 DIAGNOSIS — I10 Essential (primary) hypertension: Secondary | ICD-10-CM | POA: Diagnosis not present

## 2022-11-26 DIAGNOSIS — B0229 Other postherpetic nervous system involvement: Secondary | ICD-10-CM | POA: Diagnosis not present

## 2022-11-26 DIAGNOSIS — E785 Hyperlipidemia, unspecified: Secondary | ICD-10-CM | POA: Diagnosis not present

## 2023-03-06 DIAGNOSIS — Z1231 Encounter for screening mammogram for malignant neoplasm of breast: Secondary | ICD-10-CM | POA: Diagnosis not present

## 2023-05-20 DIAGNOSIS — I10 Essential (primary) hypertension: Secondary | ICD-10-CM | POA: Diagnosis not present

## 2023-05-20 DIAGNOSIS — E119 Type 2 diabetes mellitus without complications: Secondary | ICD-10-CM | POA: Diagnosis not present

## 2023-05-20 DIAGNOSIS — E039 Hypothyroidism, unspecified: Secondary | ICD-10-CM | POA: Diagnosis not present

## 2023-05-20 DIAGNOSIS — E785 Hyperlipidemia, unspecified: Secondary | ICD-10-CM | POA: Diagnosis not present

## 2023-05-20 DIAGNOSIS — F419 Anxiety disorder, unspecified: Secondary | ICD-10-CM | POA: Diagnosis not present

## 2023-05-20 DIAGNOSIS — B0229 Other postherpetic nervous system involvement: Secondary | ICD-10-CM | POA: Diagnosis not present

## 2023-06-14 DIAGNOSIS — N3000 Acute cystitis without hematuria: Secondary | ICD-10-CM | POA: Diagnosis not present

## 2023-06-14 DIAGNOSIS — R350 Frequency of micturition: Secondary | ICD-10-CM | POA: Diagnosis not present

## 2023-07-28 DIAGNOSIS — H04123 Dry eye syndrome of bilateral lacrimal glands: Secondary | ICD-10-CM | POA: Diagnosis not present

## 2023-07-28 DIAGNOSIS — H25813 Combined forms of age-related cataract, bilateral: Secondary | ICD-10-CM | POA: Diagnosis not present

## 2023-07-28 DIAGNOSIS — H1045 Other chronic allergic conjunctivitis: Secondary | ICD-10-CM | POA: Diagnosis not present

## 2023-07-28 DIAGNOSIS — H25043 Posterior subcapsular polar age-related cataract, bilateral: Secondary | ICD-10-CM | POA: Diagnosis not present

## 2023-07-28 DIAGNOSIS — H524 Presbyopia: Secondary | ICD-10-CM | POA: Diagnosis not present

## 2023-07-28 DIAGNOSIS — H52223 Regular astigmatism, bilateral: Secondary | ICD-10-CM | POA: Diagnosis not present

## 2023-08-28 DIAGNOSIS — H25043 Posterior subcapsular polar age-related cataract, bilateral: Secondary | ICD-10-CM | POA: Diagnosis not present

## 2023-08-28 DIAGNOSIS — H25813 Combined forms of age-related cataract, bilateral: Secondary | ICD-10-CM | POA: Diagnosis not present

## 2023-08-28 DIAGNOSIS — H04123 Dry eye syndrome of bilateral lacrimal glands: Secondary | ICD-10-CM | POA: Diagnosis not present

## 2023-09-16 DIAGNOSIS — H25812 Combined forms of age-related cataract, left eye: Secondary | ICD-10-CM | POA: Diagnosis not present

## 2023-10-07 DIAGNOSIS — H268 Other specified cataract: Secondary | ICD-10-CM | POA: Diagnosis not present

## 2023-10-07 DIAGNOSIS — H25812 Combined forms of age-related cataract, left eye: Secondary | ICD-10-CM | POA: Diagnosis not present

## 2023-10-07 DIAGNOSIS — H25042 Posterior subcapsular polar age-related cataract, left eye: Secondary | ICD-10-CM | POA: Diagnosis not present

## 2023-10-13 DIAGNOSIS — H25811 Combined forms of age-related cataract, right eye: Secondary | ICD-10-CM | POA: Diagnosis not present

## 2023-10-28 DIAGNOSIS — H25811 Combined forms of age-related cataract, right eye: Secondary | ICD-10-CM | POA: Diagnosis not present

## 2023-10-28 DIAGNOSIS — H268 Other specified cataract: Secondary | ICD-10-CM | POA: Diagnosis not present

## 2023-11-19 DIAGNOSIS — F419 Anxiety disorder, unspecified: Secondary | ICD-10-CM | POA: Diagnosis not present

## 2023-11-19 DIAGNOSIS — E119 Type 2 diabetes mellitus without complications: Secondary | ICD-10-CM | POA: Diagnosis not present

## 2023-11-19 DIAGNOSIS — E785 Hyperlipidemia, unspecified: Secondary | ICD-10-CM | POA: Diagnosis not present

## 2023-11-19 DIAGNOSIS — B0229 Other postherpetic nervous system involvement: Secondary | ICD-10-CM | POA: Diagnosis not present

## 2023-11-19 DIAGNOSIS — I10 Essential (primary) hypertension: Secondary | ICD-10-CM | POA: Diagnosis not present

## 2023-11-19 DIAGNOSIS — E039 Hypothyroidism, unspecified: Secondary | ICD-10-CM | POA: Diagnosis not present

## 2023-12-21 DIAGNOSIS — R300 Dysuria: Secondary | ICD-10-CM | POA: Diagnosis not present

## 2023-12-21 DIAGNOSIS — N390 Urinary tract infection, site not specified: Secondary | ICD-10-CM | POA: Diagnosis not present

## 2024-02-06 DIAGNOSIS — E039 Hypothyroidism, unspecified: Secondary | ICD-10-CM | POA: Diagnosis not present

## 2024-02-06 DIAGNOSIS — R5382 Chronic fatigue, unspecified: Secondary | ICD-10-CM | POA: Diagnosis not present

## 2024-02-06 DIAGNOSIS — E119 Type 2 diabetes mellitus without complications: Secondary | ICD-10-CM | POA: Diagnosis not present

## 2024-02-06 DIAGNOSIS — B0229 Other postherpetic nervous system involvement: Secondary | ICD-10-CM | POA: Diagnosis not present

## 2024-02-06 DIAGNOSIS — F419 Anxiety disorder, unspecified: Secondary | ICD-10-CM | POA: Diagnosis not present

## 2024-02-06 DIAGNOSIS — I10 Essential (primary) hypertension: Secondary | ICD-10-CM | POA: Diagnosis not present

## 2024-02-06 DIAGNOSIS — E785 Hyperlipidemia, unspecified: Secondary | ICD-10-CM | POA: Diagnosis not present

## 2024-02-15 ENCOUNTER — Emergency Department (HOSPITAL_BASED_OUTPATIENT_CLINIC_OR_DEPARTMENT_OTHER)
Admission: EM | Admit: 2024-02-15 | Discharge: 2024-02-15 | Disposition: A | Discharge status: Home / Self Care | Attending: Emergency Medicine | Admitting: Emergency Medicine

## 2024-02-15 ENCOUNTER — Emergency Department (HOSPITAL_BASED_OUTPATIENT_CLINIC_OR_DEPARTMENT_OTHER)

## 2024-02-15 ENCOUNTER — Encounter (HOSPITAL_BASED_OUTPATIENT_CLINIC_OR_DEPARTMENT_OTHER): Payer: Self-pay | Admitting: Emergency Medicine

## 2024-02-15 DIAGNOSIS — R0600 Dyspnea, unspecified: Secondary | ICD-10-CM | POA: Diagnosis not present

## 2024-02-15 DIAGNOSIS — Z79899 Other long term (current) drug therapy: Secondary | ICD-10-CM | POA: Insufficient documentation

## 2024-02-15 DIAGNOSIS — R251 Tremor, unspecified: Secondary | ICD-10-CM | POA: Diagnosis not present

## 2024-02-15 DIAGNOSIS — R5383 Other fatigue: Secondary | ICD-10-CM | POA: Insufficient documentation

## 2024-02-15 DIAGNOSIS — I10 Essential (primary) hypertension: Secondary | ICD-10-CM | POA: Diagnosis not present

## 2024-02-15 DIAGNOSIS — R0602 Shortness of breath: Secondary | ICD-10-CM | POA: Diagnosis not present

## 2024-02-15 LAB — CBC WITH DIFFERENTIAL/PLATELET
Abs Immature Granulocytes: 0.02 K/uL (ref 0.00–0.07)
Basophils Absolute: 0 K/uL (ref 0.0–0.1)
Basophils Relative: 0 %
Eosinophils Absolute: 0.1 K/uL (ref 0.0–0.5)
Eosinophils Relative: 1 %
HCT: 38.4 % (ref 36.0–46.0)
Hemoglobin: 12.7 g/dL (ref 12.0–15.0)
Immature Granulocytes: 0 %
Lymphocytes Relative: 37 %
Lymphs Abs: 3.2 K/uL (ref 0.7–4.0)
MCH: 30.8 pg (ref 26.0–34.0)
MCHC: 33.1 g/dL (ref 30.0–36.0)
MCV: 93.2 fL (ref 80.0–100.0)
Monocytes Absolute: 0.6 K/uL (ref 0.1–1.0)
Monocytes Relative: 7 %
Neutro Abs: 4.8 K/uL (ref 1.7–7.7)
Neutrophils Relative %: 55 %
Platelets: 295 K/uL (ref 150–400)
RBC: 4.12 MIL/uL (ref 3.87–5.11)
RDW: 12.6 % (ref 11.5–15.5)
WBC: 8.8 K/uL (ref 4.0–10.5)
nRBC: 0 % (ref 0.0–0.2)

## 2024-02-15 LAB — COMPREHENSIVE METABOLIC PANEL WITH GFR
ALT: 14 U/L (ref 0–44)
AST: 18 U/L (ref 15–41)
Albumin: 4.1 g/dL (ref 3.5–5.0)
Alkaline Phosphatase: 63 U/L (ref 38–126)
Anion gap: 13 (ref 5–15)
BUN: 11 mg/dL (ref 8–23)
CO2: 23 mmol/L (ref 22–32)
Calcium: 9.5 mg/dL (ref 8.9–10.3)
Chloride: 102 mmol/L (ref 98–111)
Creatinine, Ser: 0.73 mg/dL (ref 0.44–1.00)
GFR, Estimated: >60 mL/min (ref >60)
Glucose, Bld: 151 mg/dL — ABNORMAL HIGH (ref 70–99)
Potassium: 4.1 mmol/L (ref 3.5–5.1)
Sodium: 138 mmol/L (ref 135–145)
Total Bilirubin: 0.2 mg/dL (ref 0.0–1.2)
Total Protein: 7.9 g/dL (ref 6.5–8.1)

## 2024-02-15 LAB — RESP PANEL BY RT-PCR (RSV, FLU A&B, COVID)  RVPGX2
Influenza A by PCR: NEGATIVE
Influenza B by PCR: NEGATIVE
Resp Syncytial Virus by PCR: NEGATIVE
SARS Coronavirus 2 by RT PCR: NEGATIVE

## 2024-02-15 LAB — D-DIMER, QUANTITATIVE: D-Dimer, Quant: 0.28 ug{FEU}/mL (ref 0.00–0.50)

## 2024-02-15 LAB — TROPONIN T, HIGH SENSITIVITY: Troponin T High Sensitivity: <15 ng/L (ref 0–19)

## 2024-02-15 LAB — TSH: TSH: 0.137 u[IU]/mL — ABNORMAL LOW (ref 0.350–4.500)

## 2024-02-15 LAB — PRO BRAIN NATRIURETIC PEPTIDE: Pro Brain Natriuretic Peptide: 60.9 pg/mL (ref <300.0)

## 2024-02-15 LAB — T4, FREE: Free T4: 1.45 ng/dL — ABNORMAL HIGH (ref 0.61–1.12)

## 2024-02-15 NOTE — ED Notes (Signed)
 Second trop not needed per EDP.

## 2024-02-15 NOTE — ED Provider Notes (Signed)
 [place] [facility] DEPART[facility] AT [facility] Provider Note   CSN: [id number] Arrival date & time: 02/15/24  1442     Patient presents with: Shortness of Breath   Maria Houston is a 78 y.o. female.  {Add pertinent medical, surgical, social history, OB history to HPI:32947} HPI     78 year old female with history of hypertension and thyroid  disease presents with concern for shortness of breath, fatigue, tremors.   Lost husband, feeling essentially sad with holidays coming  Saw PCP did bloodwork and thought everything looked ok  Felt fatigued, like couldn't breathe on one side Began about 5 days ago No cough, no leg swelling, no chest pain, no fever When brush teeth would maybe want to bring up some mucus Clear mucus when brush teeth Thinks that with grieving after loss of 41 years No nausea, no vomiting, no abdominal pain  No black or bloody stools No long flights, no recent surgeries, no hx of dvt or PE Seeing gynecologist in December, thought UTI No history of smoking, lung disease, etoh or other drug use Adopted so don't know fam hx   Past Medical History:  Diagnosis Date   Hypertension    Thyroid  disease      Prior to Admission medications   Medication Sig Start Date End Date Taking? Authorizing Provider  azithromycin  (ZITHROMAX  Z-PAK) 250 MG tablet 2 po day one, then 1 daily x 4 days 04/15/13   Geroldine Berg, MD  chlorpheniramine-HYDROcodone  [facility] PENNKINETIC ER) 10-8 MG/5ML LQCR Take 5 mLs by mouth every 12 (twelve) hours as needed for cough. 04/15/13   Geroldine Berg, MD  estradiol (VIVELLE-DOT) 0.0375 MG/24HR APPLY 1 PATCH TWICE A WEEK BY TRANSDERMAL ROUTE. 02/15/20 02/14/21  Lenon Oneil BRAVO, MD  gabapentin  (NEURONTIN ) 100 MG capsule Take 1 capsule (100 mg total) by mouth 3 (three) times daily. 08/17/14   Pickering, Vrinda, NP  HYDROcodone -acetaminophen  (NORCO) 5-325 MG per tablet Take 1-2 tablets by mouth every 6 (six) hours as needed.  09/02/14   Geroldine Berg, MD  Levothyroxine Sodium (SYNTHROID PO) Take by mouth.    [provider]  oxyCODONE -acetaminophen  (PERCOCET/ROXICET) 5-325 MG per tablet Take 1-2 tablets by mouth every 8 (eight) hours as needed for severe pain. 08/17/14   Pickering, Vrinda, NP  predniSONE  (DELTASONE ) 10 MG tablet Take 2 tablets (20 mg total) by mouth 2 (two) times daily. 04/15/13   Geroldine Berg, MD    Allergies: Codeine, Penicillins, and Sulfa antibiotics    Review of Systems  Updated Vital Signs BP (!) 196/86 (BP Location: Right Arm)   Pulse (!) 104   Temp 98.4 F (36.9 C)   Resp (!) 24   Ht 5' 8 (1.727 m)   Wt 76.2 kg   SpO2 97%   BMI 25.54 kg/m   Physical Exam  (all labs ordered are listed, but only abnormal results are displayed) Labs Reviewed - No data to display  EKG: None  Radiology: No results found.  {Document cardiac monitor, telemetry assessment procedure when appropriate:32947} Procedures   Medications Ordered in the ED - No data to display    {Click here for ABCD2, HEART and other calculators REFRESH Note before signing:1}                               78 year old female with history of hypertension and thyroid  disease presents with concern for shortness of breath, fatigue, tremors.  {Document critical care time when appropriate  Document review of labs and clinical decision tools ie CHADS2VASC2, etc  Document your independent review of radiology images and any outside records  Document your discussion with family members, caretakers and with consultants  Document social determinants of health affecting pt's care  Document your decision making why or why not admission, treatments were needed:32947:::1}   Final diagnoses:  None    ED Discharge Orders     None

## 2024-02-15 NOTE — ED Triage Notes (Signed)
 Intermittent SHOB and fatigue, tremors x 5d; NAD at this time; pt is emotional d/t loss of husband a few months ago

## 2024-03-15 DIAGNOSIS — Z1231 Encounter for screening mammogram for malignant neoplasm of breast: Secondary | ICD-10-CM | POA: Diagnosis not present

## 2024-03-15 DIAGNOSIS — Z01419 Encounter for gynecological examination (general) (routine) without abnormal findings: Secondary | ICD-10-CM | POA: Diagnosis not present
